# Patient Record
Sex: Male | Born: 1960 | Race: White | Hispanic: No | Marital: Married | Smoking: Never smoker
Health system: Southern US, Community
[De-identification: ages and names within clinical notes are randomized; demographics above are authoritative.]

## PROBLEM LIST (undated history)

## (undated) DIAGNOSIS — J302 Other seasonal allergic rhinitis: Secondary | ICD-10-CM

## (undated) DIAGNOSIS — E119 Type 2 diabetes mellitus without complications: Secondary | ICD-10-CM

## (undated) DIAGNOSIS — K5790 Diverticulosis of intestine, part unspecified, without perforation or abscess without bleeding: Secondary | ICD-10-CM

## (undated) DIAGNOSIS — I10 Essential (primary) hypertension: Secondary | ICD-10-CM

## (undated) DIAGNOSIS — F419 Anxiety disorder, unspecified: Secondary | ICD-10-CM

## (undated) DIAGNOSIS — E785 Hyperlipidemia, unspecified: Secondary | ICD-10-CM

## (undated) DIAGNOSIS — I2510 Atherosclerotic heart disease of native coronary artery without angina pectoris: Secondary | ICD-10-CM

## (undated) DIAGNOSIS — T7840XA Allergy, unspecified, initial encounter: Secondary | ICD-10-CM

## (undated) HISTORY — PX: WISDOM TOOTH EXTRACTION: SHX21

## (undated) HISTORY — PX: NASAL RECONSTRUCTION: SHX2069

## (undated) HISTORY — PX: BILE DUCT EXPLORATION: SHX1225

## (undated) HISTORY — DX: Anxiety disorder, unspecified: F41.9

## (undated) HISTORY — DX: Essential (primary) hypertension: I10

## (undated) HISTORY — DX: Type 2 diabetes mellitus without complications: E11.9

## (undated) HISTORY — DX: Hyperlipidemia, unspecified: E78.5

## (undated) HISTORY — PX: KNEE SURGERY: SHX244

## (undated) HISTORY — DX: Diverticulosis of intestine, part unspecified, without perforation or abscess without bleeding: K57.90

## (undated) HISTORY — PX: TONSILLECTOMY: SUR1361

## (undated) HISTORY — DX: Allergy, unspecified, initial encounter: T78.40XA

---

## 2005-03-03 ENCOUNTER — Ambulatory Visit: Payer: Self-pay | Admitting: Internal Medicine

## 2005-03-21 ENCOUNTER — Ambulatory Visit: Payer: Self-pay | Admitting: Internal Medicine

## 2005-05-19 ENCOUNTER — Ambulatory Visit: Payer: Self-pay | Admitting: Internal Medicine

## 2005-05-26 ENCOUNTER — Ambulatory Visit: Payer: Self-pay | Admitting: Internal Medicine

## 2005-07-19 ENCOUNTER — Ambulatory Visit: Payer: Self-pay | Admitting: Internal Medicine

## 2005-07-28 ENCOUNTER — Ambulatory Visit: Payer: Self-pay | Admitting: Internal Medicine

## 2005-12-19 ENCOUNTER — Ambulatory Visit: Payer: Self-pay | Admitting: Internal Medicine

## 2006-01-01 ENCOUNTER — Ambulatory Visit: Payer: Self-pay | Admitting: Internal Medicine

## 2006-02-01 ENCOUNTER — Ambulatory Visit: Payer: Self-pay | Admitting: Internal Medicine

## 2006-07-16 ENCOUNTER — Ambulatory Visit: Payer: Self-pay | Admitting: Internal Medicine

## 2006-07-23 ENCOUNTER — Ambulatory Visit: Payer: Self-pay | Admitting: Internal Medicine

## 2007-11-18 ENCOUNTER — Ambulatory Visit: Payer: Self-pay | Admitting: Internal Medicine

## 2007-11-18 DIAGNOSIS — I10 Essential (primary) hypertension: Secondary | ICD-10-CM | POA: Insufficient documentation

## 2007-11-18 DIAGNOSIS — R10814 Left lower quadrant abdominal tenderness: Secondary | ICD-10-CM

## 2007-11-18 DIAGNOSIS — K5730 Diverticulosis of large intestine without perforation or abscess without bleeding: Secondary | ICD-10-CM | POA: Insufficient documentation

## 2007-11-18 LAB — CONVERTED CEMR LAB
ALT: 28 units/L (ref 0–53)
Basophils Relative: 0.4 % (ref 0.0–1.0)
Eosinophils Absolute: 0.2 10*3/uL (ref 0.0–0.6)
Hemoglobin: 15.2 g/dL (ref 13.0–17.0)
Monocytes Absolute: 0.8 10*3/uL — ABNORMAL HIGH (ref 0.2–0.7)
Monocytes Relative: 10.9 % (ref 3.0–11.0)
Neutro Abs: 3.8 10*3/uL (ref 1.4–7.7)
Platelets: 168 10*3/uL (ref 150–400)
WBC: 7 10*3/uL (ref 4.5–10.5)

## 2008-01-17 ENCOUNTER — Ambulatory Visit: Payer: Self-pay | Admitting: Internal Medicine

## 2008-01-17 DIAGNOSIS — E785 Hyperlipidemia, unspecified: Secondary | ICD-10-CM

## 2008-06-05 ENCOUNTER — Ambulatory Visit: Payer: Self-pay | Admitting: Internal Medicine

## 2008-06-05 LAB — CONVERTED CEMR LAB
ALT: 37 units/L (ref 0–53)
Alkaline Phosphatase: 47 units/L (ref 39–117)
Basophils Relative: 0.5 % (ref 0.0–1.0)
Blood in Urine, dipstick: NEGATIVE
CO2: 30 meq/L (ref 19–32)
Cholesterol: 173 mg/dL (ref 0–200)
Creatinine, Ser: 1 mg/dL (ref 0.4–1.5)
Eosinophils Absolute: 0.2 10*3/uL (ref 0.0–0.7)
GFR calc Af Amer: 104 mL/min
GFR calc non Af Amer: 86 mL/min
Glucose, Urine, Semiquant: NEGATIVE
Lymphocytes Relative: 27.4 % (ref 12.0–46.0)
MCHC: 35 g/dL (ref 30.0–36.0)
MCV: 92.1 fL (ref 78.0–100.0)
Neutro Abs: 3 10*3/uL (ref 1.4–7.7)
Neutrophils Relative %: 58.3 % (ref 43.0–77.0)
Nitrite: NEGATIVE
Platelets: 135 10*3/uL — ABNORMAL LOW (ref 150–400)
Specific Gravity, Urine: 1.025
TSH: 2.15 microintl units/mL (ref 0.35–5.50)
Total Bilirubin: 0.9 mg/dL (ref 0.3–1.2)
Total Protein: 7 g/dL (ref 6.0–8.3)
Triglycerides: 81 mg/dL (ref 0–149)
pH: 6.5

## 2008-06-16 ENCOUNTER — Telehealth: Payer: Self-pay | Admitting: *Deleted

## 2008-07-24 ENCOUNTER — Ambulatory Visit: Payer: Self-pay | Admitting: Internal Medicine

## 2008-10-23 ENCOUNTER — Ambulatory Visit: Payer: Self-pay | Admitting: Internal Medicine

## 2009-01-02 ENCOUNTER — Encounter: Payer: Self-pay | Admitting: Emergency Medicine

## 2009-01-03 ENCOUNTER — Observation Stay (HOSPITAL_COMMUNITY): Admission: EM | Admit: 2009-01-03 | Discharge: 2009-01-03 | Payer: Self-pay | Admitting: Emergency Medicine

## 2009-01-07 ENCOUNTER — Ambulatory Visit (HOSPITAL_BASED_OUTPATIENT_CLINIC_OR_DEPARTMENT_OTHER): Admission: RE | Admit: 2009-01-07 | Discharge: 2009-01-07 | Payer: Self-pay | Admitting: Plastic Surgery

## 2009-07-30 ENCOUNTER — Telehealth: Payer: Self-pay | Admitting: *Deleted

## 2009-10-01 ENCOUNTER — Ambulatory Visit: Payer: Self-pay | Admitting: Internal Medicine

## 2009-10-01 LAB — CONVERTED CEMR LAB
Alkaline Phosphatase: 71 units/L (ref 39–117)
BUN: 11 mg/dL (ref 6–23)
Bilirubin Urine: NEGATIVE
Bilirubin, Direct: 0.2 mg/dL (ref 0.0–0.3)
CO2: 29 meq/L (ref 19–32)
Calcium: 8.8 mg/dL (ref 8.4–10.5)
Chloride: 102 meq/L (ref 96–112)
Creatinine, Ser: 0.9 mg/dL (ref 0.4–1.5)
Eosinophils Absolute: 0.2 10*3/uL (ref 0.0–0.7)
GFR calc non Af Amer: 96.26 mL/min (ref >60)
HCT: 43.6 % (ref 39.0–52.0)
Ketones, urine, test strip: NEGATIVE
Lymphs Abs: 1.3 10*3/uL (ref 0.7–4.0)
Monocytes Absolute: 0.5 10*3/uL (ref 0.1–1.0)
Platelets: 129 10*3/uL — ABNORMAL LOW (ref 150.0–400.0)
Potassium: 3.8 meq/L (ref 3.5–5.1)
RBC: 4.6 M/uL (ref 4.22–5.81)
Sodium: 140 meq/L (ref 135–145)
Specific Gravity, Urine: 1.02
TSH: 1.79 microintl units/mL (ref 0.35–5.50)
Total Bilirubin: 1.2 mg/dL (ref 0.3–1.2)
Total CHOL/HDL Ratio: 4
VLDL: 14.4 mg/dL (ref 0.0–40.0)
WBC Urine, dipstick: NEGATIVE
WBC: 5.1 10*3/uL (ref 4.5–10.5)

## 2009-10-08 ENCOUNTER — Ambulatory Visit: Payer: Self-pay | Admitting: Internal Medicine

## 2010-03-04 ENCOUNTER — Ambulatory Visit (HOSPITAL_COMMUNITY): Admission: RE | Admit: 2010-03-04 | Discharge: 2010-03-04 | Payer: Self-pay | Admitting: Specialist

## 2010-03-25 ENCOUNTER — Ambulatory Visit: Payer: Self-pay | Admitting: Internal Medicine

## 2010-03-25 LAB — CONVERTED CEMR LAB
ALT: 41 U/L
AST: 45 U/L — ABNORMAL HIGH
Albumin: 3.8 g/dL
Alkaline Phosphatase: 58 U/L
Bilirubin, Direct: 0.2 mg/dL
Cholesterol: 163 mg/dL
HDL: 52.9 mg/dL
LDL Cholesterol: 95 mg/dL
Total Bilirubin: 1 mg/dL
Total CHOL/HDL Ratio: 3
Total Protein: 7 g/dL
Triglycerides: 75 mg/dL
VLDL: 15 mg/dL

## 2010-04-08 ENCOUNTER — Ambulatory Visit: Payer: Self-pay | Admitting: Internal Medicine

## 2010-04-08 LAB — CONVERTED CEMR LAB: HDL goal, serum: 40 mg/dL

## 2010-10-07 ENCOUNTER — Ambulatory Visit: Payer: Self-pay | Admitting: Internal Medicine

## 2010-10-07 LAB — CONVERTED CEMR LAB
AST: 54 units/L — ABNORMAL HIGH (ref 0–37)
Albumin: 3.6 g/dL (ref 3.5–5.2)
Alkaline Phosphatase: 82 units/L (ref 39–117)
Basophils Relative: 0.3 % (ref 0.0–3.0)
Bilirubin, Direct: 0.2 mg/dL (ref 0.0–0.3)
CO2: 30 meq/L (ref 19–32)
Calcium: 9 mg/dL (ref 8.4–10.5)
Chloride: 101 meq/L (ref 96–112)
Cholesterol: 178 mg/dL (ref 0–200)
Creatinine, Ser: 0.9 mg/dL (ref 0.4–1.5)
Eosinophils Absolute: 0.1 10*3/uL (ref 0.0–0.7)
Eosinophils Relative: 2.1 % (ref 0.0–5.0)
Glucose, Bld: 215 mg/dL — ABNORMAL HIGH (ref 70–99)
HDL: 45.7 mg/dL (ref >39.00)
Lymphs Abs: 1.3 10*3/uL (ref 0.7–4.0)
MCHC: 34.7 g/dL (ref 30.0–36.0)
MCV: 94.7 fL (ref 78.0–100.0)
Monocytes Absolute: 0.6 10*3/uL (ref 0.1–1.0)
Platelets: 106 10*3/uL — ABNORMAL LOW (ref 150.0–400.0)
Potassium: 4.3 meq/L (ref 3.5–5.1)
RBC: 4.7 M/uL (ref 4.22–5.81)
Total CHOL/HDL Ratio: 4
Triglycerides: 139 mg/dL (ref 0.0–149.0)
WBC Urine, dipstick: NEGATIVE

## 2010-10-28 ENCOUNTER — Ambulatory Visit: Payer: Self-pay | Admitting: Internal Medicine

## 2010-10-28 LAB — HM DIABETES EYE EXAM

## 2011-01-10 NOTE — Assessment & Plan Note (Signed)
Summary: 6 month rov/njr//wife rescd from [place]//ccm   Vital Signs:  Patient profile:   50 year old male Height:      71 inches Weight:      235 pounds BMI:     32.89 Temp:     98.2 degrees F oral Pulse rate:   72 / minute Resp:     14 per minute BP sitting:   122 / 80  (left arm)  Vitals Entered By: Willy Eddy, LPN (April 08, 2010 8:29 AM) CC: roa labs, Hypertension Management, Lipid Management   CC:  roa labs, Hypertension Management, and Lipid Management.  History of Present Illness: the pt has a herniated disc in the neck and has seen dr Thomasena Edis for this and has cortisone shots has lost weight  and lipids are reviewed  Hyperlipidemia Follow-Up      This is a 50 year old man who presents for Hyperlipidemia follow-up.  The patient denies muscle aches, GI upset, abdominal pain, flushing, itching, constipation, diarrhea, and fatigue.  The patient denies the following symptoms: chest pain/pressure, exercise intolerance, dypsnea, palpitations, syncope, and pedal edema.  Compliance with medications (by patient report) has been near 100%.  Dietary compliance has been good.  The patient reports exercising 3-4X per week.  Adjunctive measures currently used by the patient include fish oil supplements.    Hypertension History:      He denies headache, chest pain, palpitations, dyspnea with exertion, orthopnea, PND, peripheral edema, visual symptoms, neurologic problems, syncope, and side effects from treatment.        Positive major cardiovascular risk factors include male age 78 years old or older, hyperlipidemia, and hypertension.  Negative major cardiovascular risk factors include non-tobacco-user status.        Further assessment for target organ damage reveals no history of ASHD, stroke/TIA, or peripheral vascular disease.    Lipid Management History:      Positive NCEP/ATP III risk factors include male age 16 years old or older and hypertension.  Negative NCEP/ATP III risk  factors include non-tobacco-user status, no ASHD (atherosclerotic heart disease), no prior stroke/TIA, no peripheral vascular disease, and no history of aortic aneurysm.      Preventive Screening-Counseling & Management  Alcohol-Tobacco     Smoking Status: never  Problems Prior to Update: 1)  Preventive Health Care  (ICD-V70.0) 2)  Hyperlipidemia  (ICD-272.4) 3)  Family History of Cad Male 1st Degree Relative <50  (ICD-V17.3) 4)  Family History Diabetes 1st Degree Relative  (ICD-V18.0) 5)  Hypertension  (ICD-401.9) 6)  Abdominal Tenderness, Left Lower Quadrant  (ICD-789.64) 7)  Diverticulosis, Colon  (ICD-562.10)  Current Problems (verified): 1)  Preventive Health Care  (ICD-V70.0) 2)  Hyperlipidemia  (ICD-272.4) 3)  Family History of Cad Male 1st Degree Relative <50  (ICD-V17.3) 4)  Family History Diabetes 1st Degree Relative  (ICD-V18.0) 5)  Hypertension  (ICD-401.9) 6)  Abdominal Tenderness, Left Lower Quadrant  (ICD-789.64) 7)  Diverticulosis, Colon  (ICD-562.10)  Medications Prior to Update: 1)  Crestor 20 Mg  Tabs (Rosuvastatin Calcium) .... One By Mouth Daily 2)  Benicar 20 Mg  Tabs (Olmesartan Medoxomil) .... 1/2 Tablet  Current Medications (verified): 1)  Crestor 20 Mg  Tabs (Rosuvastatin Calcium) .... One By Mouth Daily 2)  Benicar 20 Mg  Tabs (Olmesartan Medoxomil) .... 1/2 Tablet 3)  Vicodin 5-500 Mg Tabs (Hydrocodone-Acetaminophen) .... Every 4-6 Hours As Needed Pain  Allergies (verified): No Known Drug Allergies  Past History:  Family History: Last updated: 01/17/2008  father: cancer  ( skin) mother Family History Diabetes 1st degree relative brother MI 35 Family History of CAD Male 1st degree relative <50  Social History: Last updated: 01/17/2008 Married Never Smoked Alcohol use-yes Drug use-no Regular exercise-yes  Risk Factors: Exercise: yes (01/17/2008)  Risk Factors: Smoking Status: never (04/08/2010)  Past medical, surgical, family  and social histories (including risk factors) reviewed, and no changes noted (except as noted below).  Past Medical History: Reviewed history from 01/17/2008 and no changes required. Diverticulosis, colon Hypertension Hyperlipidemia  Past Surgical History: Reviewed history from 01/17/2008 and no changes required. knee surgery  Family History: Reviewed history from 01/17/2008 and no changes required. father: cancer  ( skin) mother Family History Diabetes 1st degree relative brother MI 51 Family History of CAD Male 1st degree relative <50  Social History: Reviewed history from 01/17/2008 and no changes required. Married Never Smoked Alcohol use-yes Drug use-no Regular exercise-yes  Review of Systems  The patient denies anorexia, fever, weight loss, weight gain, vision loss, decreased hearing, hoarseness, chest pain, syncope, dyspnea on exertion, peripheral edema, prolonged cough, headaches, hemoptysis, abdominal pain, melena, hematochezia, severe indigestion/heartburn, hematuria, incontinence, genital sores, muscle weakness, suspicious skin lesions, transient blindness, difficulty walking, depression, unusual weight change, abnormal bleeding, enlarged lymph nodes, angioedema, and breast masses.    Physical Exam  General:  Well-developed,well-nourished,in no acute distress; alert,appropriate and cooperative throughout examination Head:  Normocephalic and atraumatic without obvious abnormalities. No apparent alopecia or balding. Eyes:  pupils equal and pupils round.   Ears:  R ear normal and L ear normal.   Nose:  no external deformity and no nasal discharge.   Mouth:  Oral mucosa and oropharynx without lesions or exudates.  Teeth in good repair. Neck:  No deformities, masses, or tenderness noted. Lungs:  normal respiratory effort, no intercostal retractions, and no wheezes.   Heart:  normal rate and regular rhythm.   Abdomen:  soft, non-tender, and normal bowel sounds.     Msk:  normal ROM, no joint tenderness, and no joint swelling.   Neurologic:  alert & oriented X3, cranial nerves II-XII intact, and strength normal in all extremities.     Impression & Recommendations:  Problem # 1:  HYPERLIPIDEMIA (ICD-272.4)  His updated medication list for this problem includes:    Crestor 20 Mg Tabs (Rosuvastatin calcium) ..... One by mouth daily  Labs Reviewed: SGOT: 45 (03/25/2010)   SGPT: 41 (03/25/2010)  Lipid Goals: Chol Goal: 200 (04/08/2010)   HDL Goal: 40 (04/08/2010)   LDL Goal: 130 (04/08/2010)   TG Goal: 150 (04/08/2010)  10 Yr Risk Heart Disease: 3 % Prior 10 Yr Risk Heart Disease: 9 % (10/23/2008)   HDL:52.90 (03/25/2010), 47.90 (10/01/2009)  LDL:95 (03/25/2010), 107 (60/45/4098)  Chol:163 (03/25/2010), 169 (10/01/2009)  Trig:75.0 (03/25/2010), 72.0 (10/01/2009)  Problem # 2:  HYPERTENSION (ICD-401.9)  His updated medication list for this problem includes:    Benicar 20 Mg Tabs (Olmesartan medoxomil) .Marland Kitchen... 1/2 tablet  BP today: 122/80 Prior BP: 130/82 (10/08/2009)  10 Yr Risk Heart Disease: 3 % Prior 10 Yr Risk Heart Disease: 9 % (10/23/2008)  Labs Reviewed: K+: 3.8 (10/01/2009) Creat: : 0.9 (10/01/2009)   Chol: 163 (03/25/2010)   HDL: 52.90 (03/25/2010)   LDL: 95 (03/25/2010)   TG: 75.0 (03/25/2010)  Problem # 3:  ABDOMINAL TENDERNESS, LEFT LOWER QUADRANT (ICD-789.64) resolved  Complete Medication List: 1)  Crestor 20 Mg Tabs (Rosuvastatin calcium) .... One by mouth daily 2)  Benicar 20 Mg Tabs (Olmesartan medoxomil) .Marland KitchenMarland KitchenMarland Kitchen  1/2 tablet 3)  Vicodin 5-500 Mg Tabs (Hydrocodone-acetaminophen) .... Every 4-6 hours as needed pain  Hypertension Assessment/Plan:      The patient's hypertensive risk group is category B: At least one risk factor (excluding diabetes) with no target organ damage.  His calculated 10 year risk of coronary heart disease is 3 %.  Today's blood pressure is 122/80.  His blood pressure goal is < 140/90.  Lipid  Assessment/Plan:      Based on NCEP/ATP III, the patient's risk factor category is "2 or more risk factors and a calculated 10 year CAD risk of < 20%".  The patient's lipid goals are as follows: Total cholesterol goal is 200; LDL cholesterol goal is 130; HDL cholesterol goal is 40; Triglyceride goal is 150.  His LDL cholesterol goal has been met.    Patient Instructions: 1)  OCT CPX

## 2011-01-10 NOTE — Assessment & Plan Note (Signed)
Summary: cpx/njr/wife rescd//ccm   Vital Signs:  Patient profile:   50 year old male Height:      71 inches Weight:      238 pounds BMI:     33.31 Temp:     98.2 degrees F oral Pulse rate:   72 / minute Resp:     14 per minute BP sitting:   130 / 90  (left arm)  Vitals Entered By: Willy Eddy, LPN (October 28, 2010 2:13 PM) CC: cpx, Hypertension Management Is Patient Diabetic? No   Primary Care Provider:  Stacie Glaze MD  CC:  cpx and Hypertension Management.  History of Present Illness: The pt was asked about all immunizations, health maint. services that are appropriate to their age and was given guidance on diet exercize  and weight management  HTN stable weight gain and genetic predisposition has resulted in him becoming an AODM ( new diagnosis) wwe spent 30 min on teaching, monitering and explaining thje benifits and risks on new medications  Hypertension History:      He denies headache, chest pain, palpitations, dyspnea with exertion, orthopnea, PND, peripheral edema, visual symptoms, neurologic problems, syncope, and side effects from treatment.        Positive major cardiovascular risk factors include male age 6 years old or older, diabetes, hyperlipidemia, and hypertension.  Negative major cardiovascular risk factors include non-tobacco-user status.        Further assessment for target organ damage reveals no history of ASHD, stroke/TIA, or peripheral vascular disease.     Preventive Screening-Counseling & Management  Alcohol-Tobacco     Smoking Status: never  Problems Prior to Update: 1)  Diab W/oth Manifests Type Ii/uns Type Uncntrl  (ICD-250.82) 2)  Preventive Health Care  (ICD-V70.0) 3)  Hyperlipidemia  (ICD-272.4) 4)  Family History of Cad Male 1st Degree Relative <50  (ICD-V17.3) 5)  Family History Diabetes 1st Degree Relative  (ICD-V18.0) 6)  Hypertension  (ICD-401.9) 7)  Abdominal Tenderness, Left Lower Quadrant  (ICD-789.64) 8)   Diverticulosis, Colon  (ICD-562.10)  Current Problems (verified): 1)  Preventive Health Care  (ICD-V70.0) 2)  Hyperlipidemia  (ICD-272.4) 3)  Family History of Cad Male 1st Degree Relative <50  (ICD-V17.3) 4)  Family History Diabetes 1st Degree Relative  (ICD-V18.0) 5)  Hypertension  (ICD-401.9) 6)  Abdominal Tenderness, Left Lower Quadrant  (ICD-789.64) 7)  Diverticulosis, Colon  (ICD-562.10)  Medications Prior to Update: 1)  Crestor 20 Mg  Tabs (Rosuvastatin Calcium) .... One By Mouth Daily 2)  Benicar 20 Mg  Tabs (Olmesartan Medoxomil) .... 1/2 Tablet 3)  Vicodin 5-500 Mg Tabs (Hydrocodone-Acetaminophen) .... Every 4-6 Hours As Needed Pain  Current Medications (verified): 1)  Crestor 20 Mg  Tabs (Rosuvastatin Calcium) .... One By Mouth Daily 2)  Benicar 20 Mg  Tabs (Olmesartan Medoxomil) .... 1/2 Tablet 3)  Kombiglyze Xr 2.04-999 Mg Xr24h-Tab (Saxagliptin-Metformin) .... One By Mouth Daily  Allergies (verified): No Known Drug Allergies  Past History:  Family History: Last updated: 01/17/2008 father: cancer  ( skin) mother Family History Diabetes 1st degree relative brother MI 63 Family History of CAD Male 1st degree relative <50  Social History: Last updated: 01/17/2008 Married Never Smoked Alcohol use-yes Drug use-no Regular exercise-yes  Risk Factors: Exercise: yes (01/17/2008)  Risk Factors: Smoking Status: never (10/28/2010)  Past medical, surgical, family and social histories (including risk factors) reviewed, and no changes noted (except as noted below).  Past Medical History: Reviewed history from 01/17/2008 and no changes required. Diverticulosis,  colon Hypertension Hyperlipidemia  Past Surgical History: Reviewed history from 01/17/2008 and no changes required. knee surgery  Family History: Reviewed history from 01/17/2008 and no changes required. father: cancer  ( skin) mother Family History Diabetes 1st degree relative brother MI  69 Family History of CAD Male 1st degree relative <50  Social History: Reviewed history from 01/17/2008 and no changes required. Married Never Smoked Alcohol use-yes Drug use-no Regular exercise-yes  Review of Systems  The patient denies anorexia, fever, weight loss, weight gain, vision loss, decreased hearing, hoarseness, chest pain, syncope, dyspnea on exertion, peripheral edema, prolonged cough, headaches, hemoptysis, abdominal pain, melena, hematochezia, severe indigestion/heartburn, hematuria, incontinence, genital sores, muscle weakness, suspicious skin lesions, transient blindness, difficulty walking, depression, unusual weight change, abnormal bleeding, enlarged lymph nodes, angioedema, breast masses, and testicular masses.    Physical Exam  General:  Well-developed,well-nourished,in no acute distress; alert,appropriate and cooperative throughout examination Head:  Normocephalic and atraumatic without obvious abnormalities. No apparent alopecia or balding. Eyes:  pupils equal and pupils round.   Ears:  R ear normal and L ear normal.   Nose:  no external deformity and no nasal discharge.   Mouth:  Oral mucosa and oropharynx without lesions or exudates.  Teeth in good repair. Neck:  No deformities, masses, or tenderness noted. Chest Wall:  no deformities and no tenderness.   Lungs:  normal respiratory effort, no intercostal retractions, and no wheezes.   Heart:  normal rate and regular rhythm.   Abdomen:  soft, non-tender, and normal bowel sounds.   Rectal:  normal sphincter tone and no masses.   Prostate:  no gland enlargement and no asymmetry.   Msk:  No deformity or scoliosis noted of thoracic or lumbar spine.   Pulses:  R and L carotid,radial,femoral,dorsalis pedis and posterior tibial pulses are full and equal bilaterally  Diabetes Management Exam:    Foot Exam (with socks and/or shoes not present):       Sensory-Pinprick/Light touch:          Left medial foot (L-4):  normal          Left dorsal foot (L-5): normal          Left lateral foot (S-1): normal          Right medial foot (L-4): normal          Right dorsal foot (L-5): normal          Right lateral foot (S-1): normal       Sensory-Monofilament:          Left foot: normal          Right foot: normal       Inspection:          Left foot: normal          Right foot: normal       Nails:          Left foot: normal          Right foot: normal   Impression & Recommendations:  Problem # 1:  PREVENTIVE HEALTH CARE (ICD-V70.0)  Td Booster: Tdap (10/28/2010)   Chol: 178 (10/07/2010)   HDL: 45.70 (10/07/2010)   LDL: 105 (10/07/2010)   TG: 139.0 (10/07/2010) TSH: 1.99 (10/07/2010)    Discussed using sunscreen, use of alcohol, drug use, self testicular exam, routine dental care, routine eye care, routine physical exam, seat belts, multiple vitamins, osteoporosis prevention, adequate calcium intake in diet, and recommendations for immunizations.  Discussed exercise and checking  cholesterol.  Discussed gun safety, safe sex, and contraception. Also recommend checking PSA.  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 1/2 tablet    Kombiglyze Xr 2.04-999 Mg Xr24h-tab (Saxagliptin-metformin) ..... One by mouth daily  Problem # 3:  HYPERTENSION (ICD-401.9) Assessment: Unchanged  His updated medication list for this problem includes:    Benicar 20 Mg Tabs (Olmesartan medoxomil) .Marland Kitchen... 1/2 tablet  BP today: 130/90 Prior BP: 122/80 (04/08/2010)  10 Yr Risk Heart Disease: 14 % Prior 10 Yr Risk Heart Disease: 3 % (04/08/2010)  Labs Reviewed: K+: 4.3 (10/07/2010) Creat: : 0.9 (10/07/2010)   Chol: 178 (10/07/2010)   HDL: 45.70 (10/07/2010)   LDL: 105 (10/07/2010)   TG: 139.0  (10/07/2010)  Complete Medication List: 1)  Crestor 20 Mg Tabs (Rosuvastatin calcium) .... One by mouth daily 2)  Benicar 20 Mg Tabs (Olmesartan medoxomil) .... 1/2 tablet 3)  Kombiglyze Xr 2.04-999 Mg Xr24h-tab (Saxagliptin-metformin) .... One by mouth daily  Other Orders: Tdap => 43yrs IM (16109) Admin 1st Vaccine (60454)  Hypertension Assessment/Plan:      The patient's hypertensive risk group is category C: Target organ damage and/or diabetes.  His calculated 10 year risk of coronary heart disease is 14 %.  Today's blood pressure is 130/90.  His blood pressure goal is < 140/90.  Patient Instructions: 1)  Please schedule a follow-up appointment in 2 months. 2)  BMP prior to visit, ICD-9:  995.20 3)  HbgA1C prior to visit, ICD-9:250.00   Orders Added: 1)  Tdap => 30yrs IM [90715] 2)  Admin 1st Vaccine [90471] 3)  Est. Patient 40-64 years [99396] 4)  Est. Patient Level IV [09811]   Immunizations Administered:  Tetanus Vaccine:    Vaccine Type: Tdap    Site: left deltoid    Mfr: GlaxoSmithKline    Dose: 0.5 ml    Route: IM    Given by: Willy Eddy, LPN    Exp. Date: 09/29/2012    Lot #: [id number]    VIS given: 10/28/08 version given October 28, 2010.   Immunizations Administered:  Tetanus Vaccine:    Vaccine Type: Tdap    Site: left deltoid    Mfr: GlaxoSmithKline    Dose: 0.5 ml    Route: IM    Given by: Willy Eddy, LPN    Exp. Date: 09/29/2012    Lot #: [id number]    VIS given: 10/28/08 version given October 28, 2010.

## 2011-02-03 ENCOUNTER — Other Ambulatory Visit (INDEPENDENT_AMBULATORY_CARE_PROVIDER_SITE_OTHER): Payer: 59 | Admitting: Internal Medicine

## 2011-02-03 DIAGNOSIS — E119 Type 2 diabetes mellitus without complications: Secondary | ICD-10-CM

## 2011-02-03 LAB — BASIC METABOLIC PANEL
Creatinine, Ser: 1 mg/dL (ref 0.4–1.5)
GFR: 85.74 mL/min (ref >60.00)

## 2011-02-03 LAB — HEMOGLOBIN A1C: Hgb A1c MFr Bld: 6.9 % — ABNORMAL HIGH (ref 4.6–6.5)

## 2011-02-09 ENCOUNTER — Encounter: Payer: Self-pay | Admitting: Internal Medicine

## 2011-02-10 ENCOUNTER — Ambulatory Visit (INDEPENDENT_AMBULATORY_CARE_PROVIDER_SITE_OTHER): Payer: 59 | Admitting: Internal Medicine

## 2011-02-10 ENCOUNTER — Encounter: Payer: Self-pay | Admitting: Internal Medicine

## 2011-02-10 DIAGNOSIS — F411 Generalized anxiety disorder: Secondary | ICD-10-CM

## 2011-02-10 DIAGNOSIS — I10 Essential (primary) hypertension: Secondary | ICD-10-CM

## 2011-02-10 DIAGNOSIS — E785 Hyperlipidemia, unspecified: Secondary | ICD-10-CM

## 2011-02-10 DIAGNOSIS — F419 Anxiety disorder, unspecified: Secondary | ICD-10-CM

## 2011-02-10 DIAGNOSIS — F329 Major depressive disorder, single episode, unspecified: Secondary | ICD-10-CM

## 2011-02-10 DIAGNOSIS — E1165 Type 2 diabetes mellitus with hyperglycemia: Secondary | ICD-10-CM

## 2011-02-10 MED ORDER — SITAGLIPTIN PHOS-METFORMIN HCL 50-1000 MG PO TABS: 1.0000 | ORAL_TABLET | Freq: Two times a day (BID) | ORAL | Status: DC

## 2011-02-10 MED ORDER — SERTRALINE HCL 25 MG PO TABS: 25.0000 mg | ORAL_TABLET | Freq: Every day | ORAL | Status: DC

## 2011-02-10 NOTE — Assessment & Plan Note (Signed)
The patient will be placed on 25 mg of Zoloft trial followup in 3 months
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
## 2011-02-10 NOTE — Assessment & Plan Note (Signed)
Marked decrease in his blood glucoses down from 215 to the 150s however our goal is to get his A1c below 6-1/2 so that he will not have any risk for either kidney or heart disease do that would change kombiglyze  To the 1000/5 and I believe this should result in getting her A1c down we will monitor and A1c a lipid and liver in 3 months time

## 2011-02-10 NOTE — Assessment & Plan Note (Signed)
Weight loss  And medications are helping control BP and risk

## 2011-02-10 NOTE — Assessment & Plan Note (Signed)
Will monitor lipids on crestor Has lost weight

## 2011-03-27 LAB — CBC
HCT: 38.3 % — ABNORMAL LOW (ref 39.0–52.0)
Hemoglobin: 15.9 g/dL (ref 13.0–17.0)
MCHC: 34.2 g/dL (ref 30.0–36.0)
Platelets: 149 10*3/uL — ABNORMAL LOW (ref 150–400)
Platelets: 166 10*3/uL (ref 150–400)
RBC: 5.25 MIL/uL (ref 4.22–5.81)
RDW: 12.7 % (ref 11.5–15.5)
WBC: 8.1 10*3/uL (ref 4.0–10.5)

## 2011-03-27 LAB — SAMPLE TO BLOOD BANK

## 2011-03-27 LAB — POCT I-STAT, CHEM 8
BUN: 13 mg/dL (ref 6–23)
BUN: 9 mg/dL (ref 6–23)
Calcium, Ion: 0.94 mmol/L — ABNORMAL LOW (ref 1.12–1.32)
Chloride: 105 mEq/L (ref 96–112)
Chloride: 106 mEq/L (ref 96–112)
Glucose, Bld: 135 mg/dL — ABNORMAL HIGH (ref 70–99)
Potassium: 3.3 mEq/L — ABNORMAL LOW (ref 3.5–5.1)
Potassium: 3.7 mEq/L (ref 3.5–5.1)
TCO2: 27 mmol/L (ref 0–100)

## 2011-04-25 NOTE — Op Note (Signed)
NAME:  HOLLAND, NICKSON NO.:  [id number]   MEDICAL RECORD NO.:  [id number]          PATIENT TYPE:  INP   LOCATION:  5128                         FACILITY:  [facility]   PHYSICIAN:  Adolph Pollack, M.D.DATE OF BIRTH:  12-06-1961   DATE OF PROCEDURE:  01/03/2009  DATE OF DISCHARGE:                               OPERATIVE REPORT   PREOPERATIVE DIAGNOSIS:  Two right-sided nasal lacerations.   POSTOPERATIVE DIAGNOSIS:  Two right-sided nasal lacerations.   PROCEDURE:  Closure of nasal lacerations with Dermabond glue.   SURGEON:  Adolph Pollack, MD   TECHNIQUE:  The areas were sterilely cleansed and then dried.  There was  an 8-mm laceration closed in the right side of the nose and a 4-mm  laceration closed, both with glue.  This was well away from the eye and  the eyelids worked well.  He tolerated the procedure well without  apparent complications.      Adolph Pollack, M.D.  Electronically Signed     TJR/MEDQ  D:  01/03/2009  T:  01/03/2009  Job:  [id number]
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
## 2011-05-12 ENCOUNTER — Other Ambulatory Visit (INDEPENDENT_AMBULATORY_CARE_PROVIDER_SITE_OTHER): Payer: 59

## 2011-05-12 DIAGNOSIS — I10 Essential (primary) hypertension: Secondary | ICD-10-CM

## 2011-05-12 LAB — LIPID PANEL
Cholesterol: 184 mg/dL (ref 0–200)
HDL: 63.7 mg/dL (ref >39.00)
LDL Cholesterol: 109 mg/dL — ABNORMAL HIGH (ref 0–99)
Total CHOL/HDL Ratio: 3
Triglycerides: 56 mg/dL (ref 0.0–149.0)

## 2011-05-12 LAB — HEPATIC FUNCTION PANEL
ALT: 32 U/L (ref 0–53)
AST: 38 U/L — ABNORMAL HIGH (ref 0–37)
Total Bilirubin: 1.6 mg/dL — ABNORMAL HIGH (ref 0.3–1.2)

## 2011-05-19 ENCOUNTER — Ambulatory Visit (INDEPENDENT_AMBULATORY_CARE_PROVIDER_SITE_OTHER): Payer: 59 | Admitting: Internal Medicine

## 2011-05-19 ENCOUNTER — Encounter: Payer: Self-pay | Admitting: Internal Medicine

## 2011-05-19 VITALS — BP 130/78 | HR 60 | Temp 98.2°F | Resp 16 | Ht 72.0 in | Wt 222.0 lb

## 2011-05-19 DIAGNOSIS — F411 Generalized anxiety disorder: Secondary | ICD-10-CM

## 2011-05-19 DIAGNOSIS — E1165 Type 2 diabetes mellitus with hyperglycemia: Secondary | ICD-10-CM

## 2011-05-19 DIAGNOSIS — F419 Anxiety disorder, unspecified: Secondary | ICD-10-CM

## 2011-05-19 DIAGNOSIS — I10 Essential (primary) hypertension: Secondary | ICD-10-CM

## 2011-05-19 MED ORDER — SITAGLIPTIN PHOS-METFORMIN HCL 50-1000 MG PO TABS: 1.0000 | ORAL_TABLET | Freq: Two times a day (BID) | ORAL | Status: DC

## 2011-05-19 MED ORDER — SERTRALINE HCL 25 MG PO TABS: 25.0000 mg | ORAL_TABLET | Freq: Every day | ORAL | Status: DC
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
## 2011-07-19 ENCOUNTER — Encounter: Payer: Self-pay | Admitting: Internal Medicine

## 2011-07-19 ENCOUNTER — Ambulatory Visit (INDEPENDENT_AMBULATORY_CARE_PROVIDER_SITE_OTHER): Payer: 59 | Admitting: Internal Medicine

## 2011-07-19 DIAGNOSIS — L570 Actinic keratosis: Secondary | ICD-10-CM
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
## 2011-08-06 IMAGING — CR DG ORBITS FOR FOREIGN BODY
2 series · 2 of 2 positions shown · non-contrast
Comparison: CT 01/02/2009

CLINICAL DATA: Metal exposure.  Assess for orbital foreign object.

ORBITS FOR FOREIGN BODY - 2 VIEW

[w waters (1 of 2)]
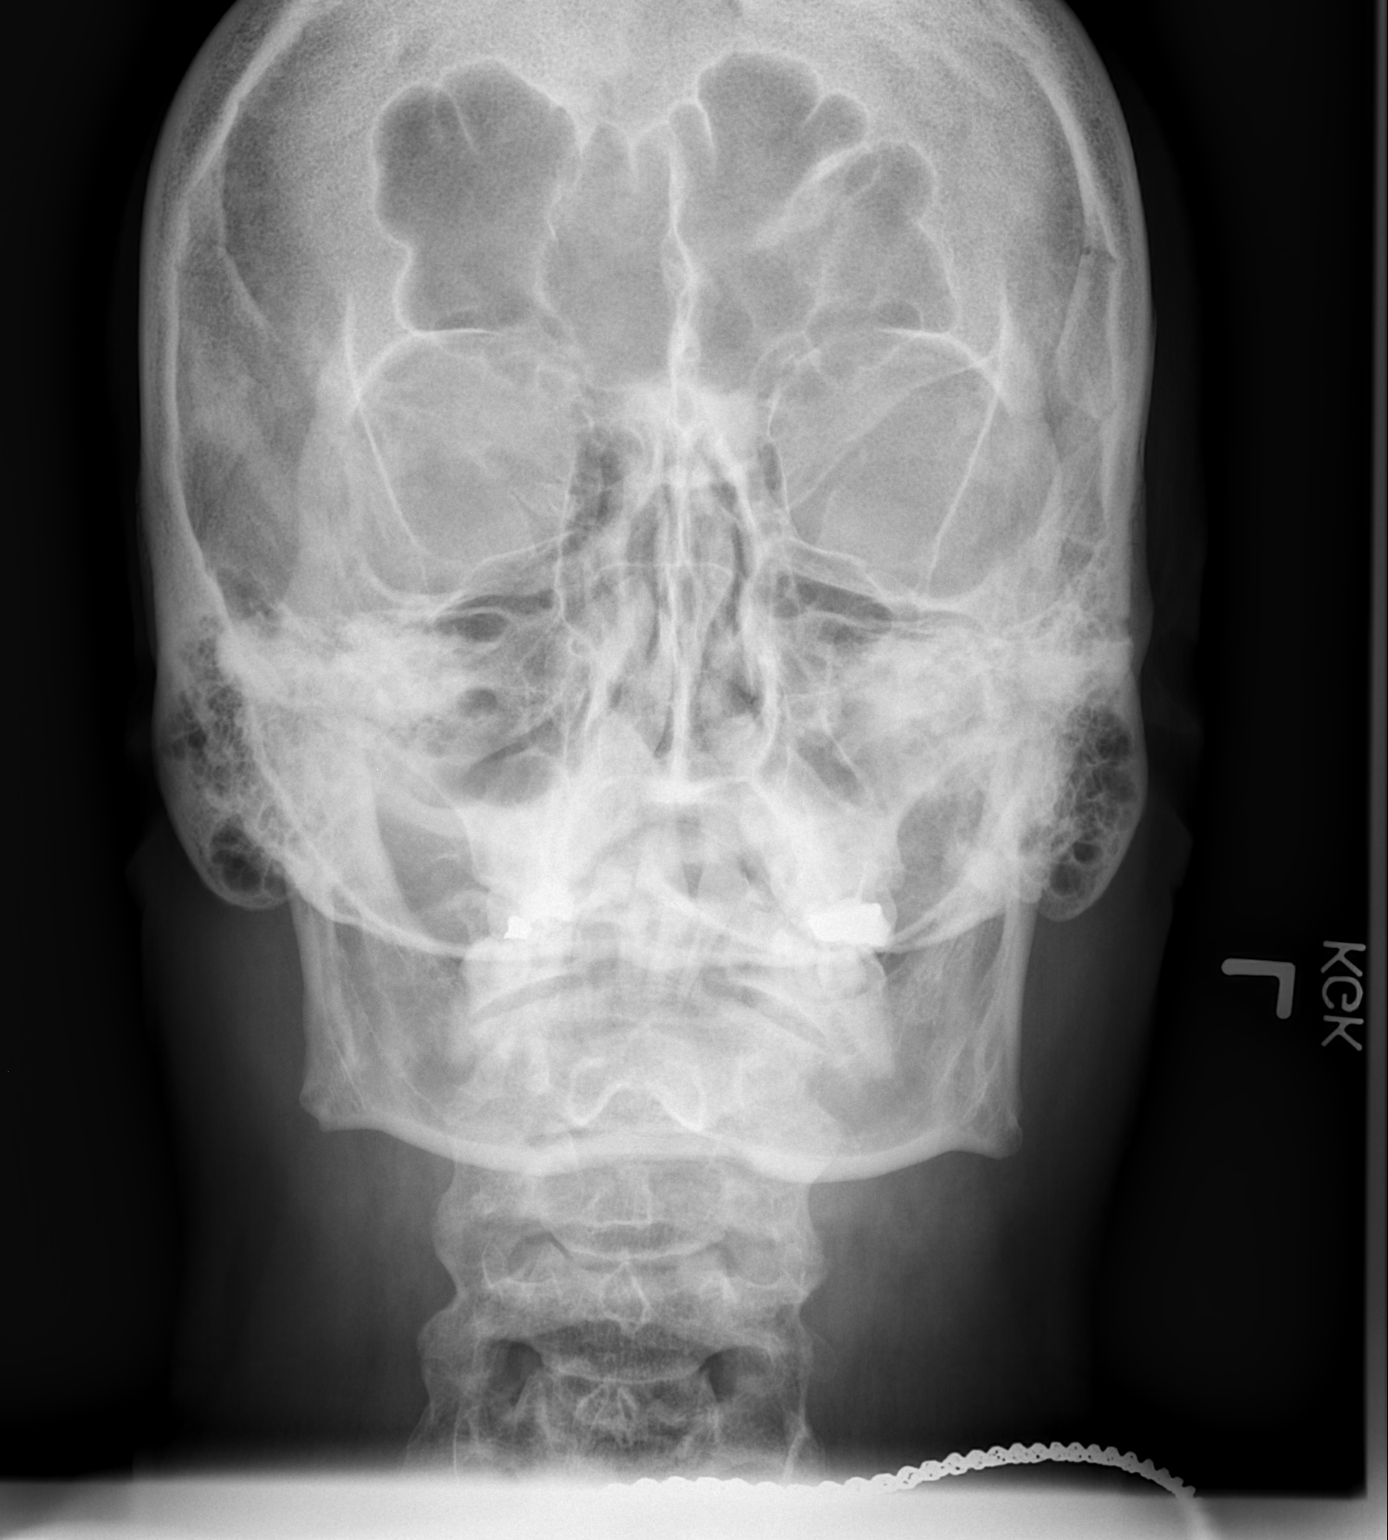

[w waters (2 of 2)]
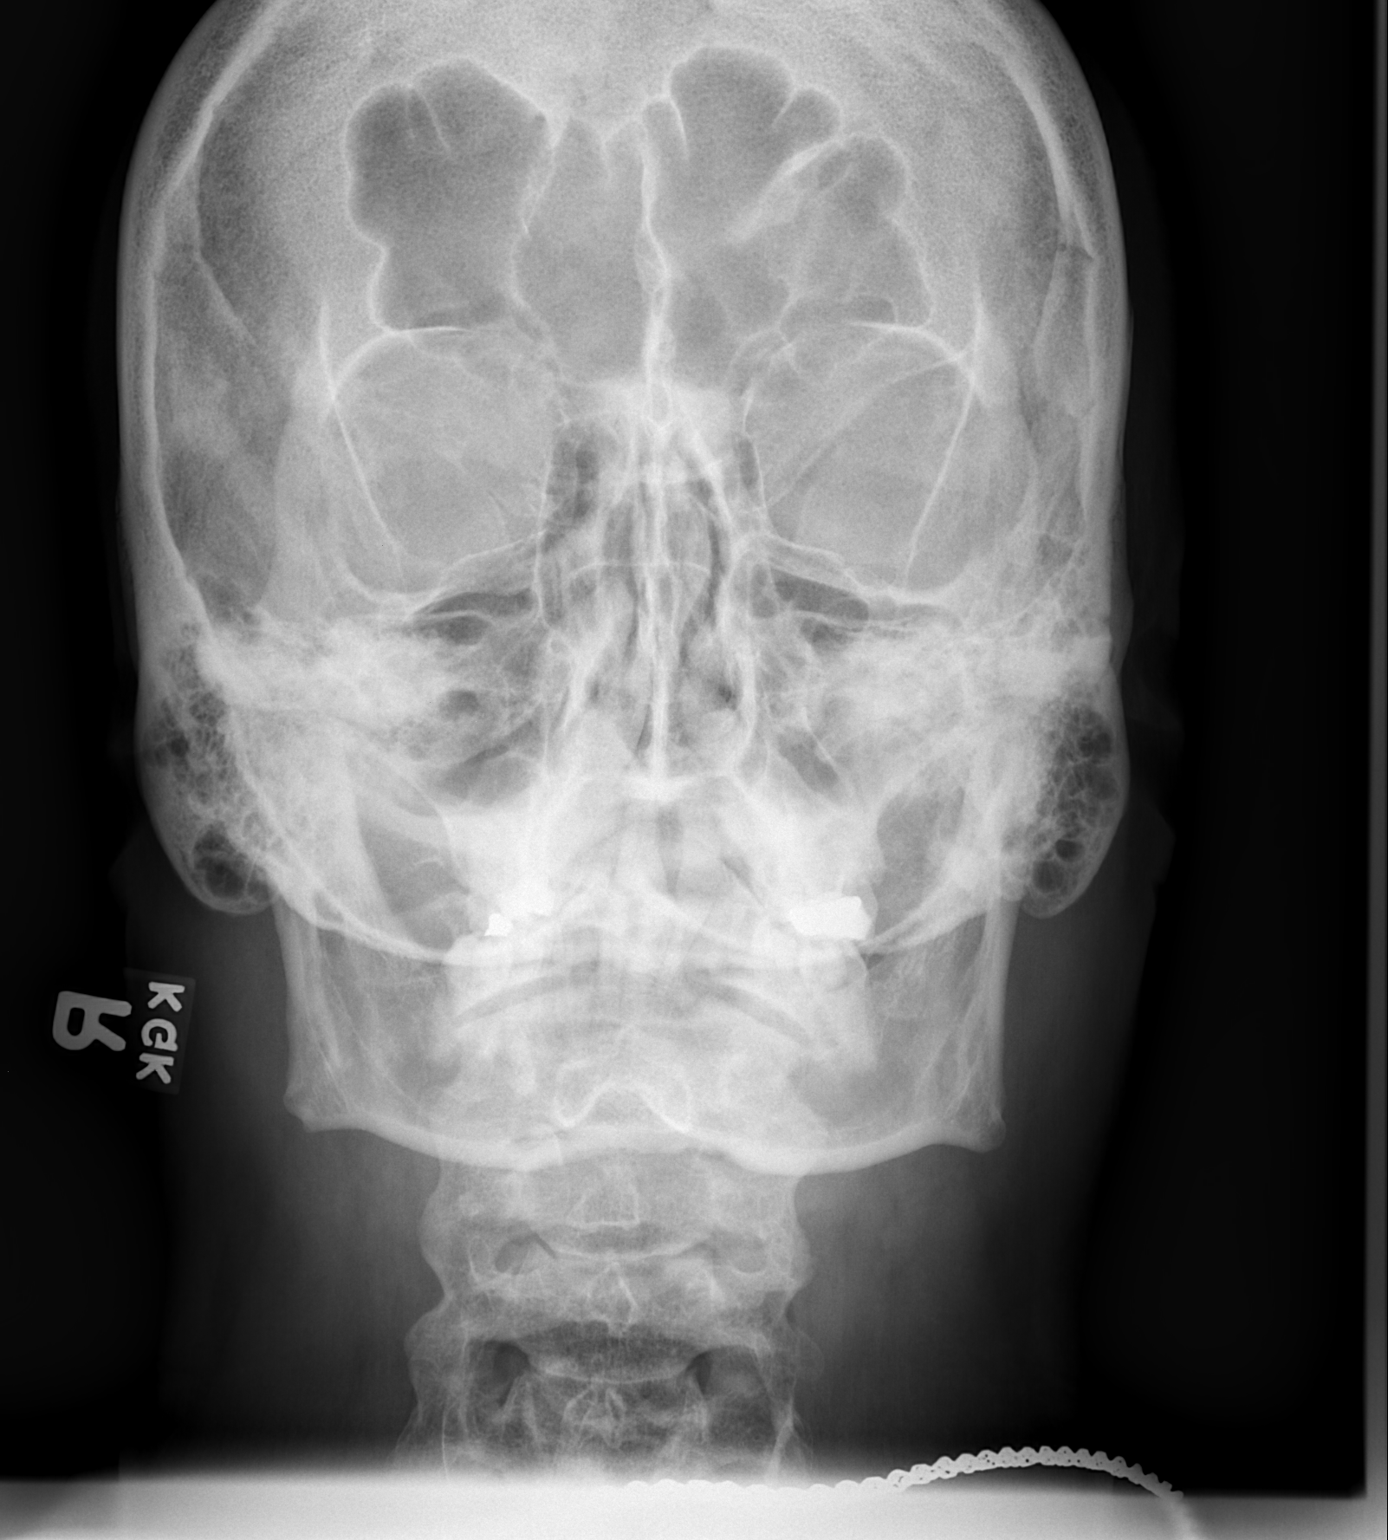

[2 of 2 positions shown; findings below may reference images not displayed]

FINDINGS: No sign of radiopaque foreign object in the region of
either orbit.  Sinuses are clear.
IMPRESSION: No sign of radiopaque foreign object in the region of either orbit.

## 2011-09-15 ENCOUNTER — Other Ambulatory Visit (INDEPENDENT_AMBULATORY_CARE_PROVIDER_SITE_OTHER): Payer: 59

## 2011-09-15 DIAGNOSIS — E1165 Type 2 diabetes mellitus with hyperglycemia: Secondary | ICD-10-CM

## 2011-09-15 LAB — HEMOGLOBIN A1C: Hgb A1c MFr Bld: 7.1 % — ABNORMAL HIGH (ref 4.6–6.5)

## 2011-09-22 ENCOUNTER — Encounter: Payer: Self-pay | Admitting: Internal Medicine

## 2011-09-22 ENCOUNTER — Ambulatory Visit (INDEPENDENT_AMBULATORY_CARE_PROVIDER_SITE_OTHER): Payer: 59 | Admitting: Internal Medicine

## 2011-09-22 VITALS — BP 136/80 | HR 76 | Temp 98.2°F | Resp 16 | Ht 72.0 in | Wt 230.0 lb

## 2011-09-22 DIAGNOSIS — F329 Major depressive disorder, single episode, unspecified: Secondary | ICD-10-CM

## 2011-09-22 DIAGNOSIS — E785 Hyperlipidemia, unspecified: Secondary | ICD-10-CM

## 2011-09-22 DIAGNOSIS — E1165 Type 2 diabetes mellitus with hyperglycemia: Secondary | ICD-10-CM

## 2011-09-22 DIAGNOSIS — K5730 Diverticulosis of large intestine without perforation or abscess without bleeding: Secondary | ICD-10-CM

## 2011-09-22 DIAGNOSIS — E1169 Type 2 diabetes mellitus with other specified complication: Secondary | ICD-10-CM

## 2011-09-22 DIAGNOSIS — I10 Essential (primary) hypertension: Secondary | ICD-10-CM
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
## 2011-09-22 NOTE — Patient Instructions (Signed)
Watch calories Set goal for weight loss of 8-10 lbs

## 2012-01-26 ENCOUNTER — Other Ambulatory Visit: Payer: 59

## 2012-02-02 ENCOUNTER — Ambulatory Visit: Payer: 59 | Admitting: Internal Medicine

## 2012-02-26 ENCOUNTER — Telehealth: Payer: Self-pay | Admitting: Family Medicine

## 2012-02-26 DIAGNOSIS — F419 Anxiety disorder, unspecified: Secondary | ICD-10-CM

## 2012-02-26 MED ORDER — SERTRALINE HCL 25 MG PO TABS: 25.0000 mg | ORAL_TABLET | Freq: Every day | ORAL | Status: DC

## 2012-02-26 NOTE — Telephone Encounter (Signed)
done

## 2012-02-26 NOTE — Telephone Encounter (Signed)
Requesting refill of Zoloft be called in to the [facility] on [address]

## 2012-07-22 ENCOUNTER — Other Ambulatory Visit: Payer: Self-pay | Admitting: *Deleted

## 2012-07-22 MED ORDER — SITAGLIPTIN PHOS-METFORMIN HCL 50-1000 MG PO TABS: 1.0000 | ORAL_TABLET | Freq: Two times a day (BID) | ORAL | Status: DC

## 2012-11-28 ENCOUNTER — Other Ambulatory Visit: Payer: Self-pay | Admitting: Internal Medicine

## 2012-12-12 ENCOUNTER — Other Ambulatory Visit: Payer: Self-pay | Admitting: *Deleted

## 2012-12-19 ENCOUNTER — Telehealth: Payer: Self-pay | Admitting: *Deleted

## 2012-12-19 MED ORDER — LINAGLIPTIN-METFORMIN HCL 2.5-1000 MG PO TABS: 2.5000 mg | ORAL_TABLET | Freq: Every day | ORAL | Status: DC

## 2012-12-19 NOTE — Telephone Encounter (Signed)
Received message from [facility]- need to change janumet-- per dr Lovell Sheehan can change to janudueto 2.5/`1000 qd Left message on machine For pt

## 2013-01-10 ENCOUNTER — Ambulatory Visit (INDEPENDENT_AMBULATORY_CARE_PROVIDER_SITE_OTHER): Payer: 59 | Admitting: Family Medicine

## 2013-01-10 VITALS — BP 124/84 | HR 68 | Temp 98.2°F | Resp 16 | Ht 70.0 in | Wt 232.0 lb

## 2013-01-10 DIAGNOSIS — Z0289 Encounter for other administrative examinations: Secondary | ICD-10-CM

## 2013-01-10 DIAGNOSIS — E119 Type 2 diabetes mellitus without complications: Secondary | ICD-10-CM

## 2013-01-10 LAB — GLUCOSE, POCT (MANUAL RESULT ENTRY): POC Glucose: 126 mg/dl — AB (ref 70–99)

## 2013-01-10 NOTE — Progress Notes (Signed)
  Subjective:    Patient ID: Dylan Blake, male    DOB: 03/02/1961, 52 y.o.   MRN: [id number]  HPI Dylan Blake is a 53 y.o. male  Here for DOT physical. . Primary care Dr. Darryll Capers -last physical July of last year. Planning on physical there with primary provider this year.   No prior hx of DOT exam, but [place] in [id number]. Works for [employer]. 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  DM2-  Takes Janumet - 2.04/999 - twice per day.  Misses doses frequently - takes meds mostly once per day.   Does not check home blood sugars.  Last eval at primary care provider - 09/2011. No hx of symptomatic low blood sugars.   Results for orders placed in visit on 09/15/11  HEMOGLOBIN A1C      Component Value Range   Hemoglobin A1C 7.1 (*) 4.6 - 6.5 %   HTN - on Benicar for HTN.  No chest pains, lightheadedness or dizziness with HTN meds.   Review of Systems As above.     Objective:   Physical Exam  Vitals reviewed. Constitutional: He is oriented to person, place, and time. He appears well-developed and well-nourished.  HENT:  Head: Normocephalic and atraumatic.  Right Ear: External ear normal.  Left Ear: External ear normal.  Mouth/Throat: Oropharynx is clear and moist.  Eyes: Conjunctivae normal and EOM are normal. Pupils are equal, round, and reactive to light.  Neck: Normal range of motion. Neck supple. No thyromegaly present.  Cardiovascular: Normal rate, regular rhythm, normal heart sounds and intact distal pulses.   Pulmonary/Chest: Effort normal and breath sounds normal. No respiratory distress. He has no wheezes.  Abdominal: Soft. He exhibits no distension. There is no tenderness. Hernia confirmed negative in the right inguinal area and confirmed negative in the left inguinal area.  Musculoskeletal: Normal range of motion. He exhibits no edema and no tenderness.  Lymphadenopathy:    He  has no cervical adenopathy.  Neurological: He is alert and oriented to person, place, and time. He has normal reflexes.       nvi distally. Microfilament testing wnl.   Skin: Skin is warm and dry.  Psychiatric: He has a normal mood and affect. His behavior is normal.    Urinalysis - screen: neg protein, neg blood, positive for sugar - 250 Results for orders placed in visit on 01/10/13  GLUCOSE, POCT (MANUAL RESULT ENTRY)      Component Value Range   POC Glucose 126 (*) 70 - 99 mg/dl      Assessment & Plan:  Dylan Blake is a 52 y.o. male 1. Diabetes mellitus  POCT glucose (manual entry)  2. Health examination of defined subpopulation     DOT PE - diabetes without recent eval at primary care provider. Noted glucose in office, but needs follow up with primary provider including A1c and to discuss BP/other medical problems. Understanding discussed. 6 month DOT card.   Patient Instructions  Call your doctor to follow up your diabetes.  It is necessary to be evaluated by your doctor at least every 6 months for diabetes. Your DOT card will be good for 6 months this time.

## 2013-01-10 NOTE — Patient Instructions (Signed)
Call your doctor to follow up your diabetes.  It is necessary to be evaluated by your doctor at least every 6 months for diabetes. Your DOT card will be good for 6 months this time.

## 2013-01-14 ENCOUNTER — Encounter: Payer: Self-pay | Admitting: Family Medicine

## 2013-02-20 ENCOUNTER — Encounter: Payer: Self-pay | Admitting: Internal Medicine

## 2013-02-20 ENCOUNTER — Ambulatory Visit (INDEPENDENT_AMBULATORY_CARE_PROVIDER_SITE_OTHER): Payer: 59 | Admitting: Internal Medicine

## 2013-02-20 VITALS — BP 140/80 | HR 74 | Temp 98.4°F | Resp 20 | Wt 238.0 lb

## 2013-02-20 DIAGNOSIS — L723 Sebaceous cyst: Secondary | ICD-10-CM

## 2013-02-20 DIAGNOSIS — L089 Local infection of the skin and subcutaneous tissue, unspecified: Secondary | ICD-10-CM

## 2013-02-20 MED ORDER — AMOXICILLIN-POT CLAVULANATE 875-125 MG PO TABS: 1.0000 | ORAL_TABLET | Freq: Two times a day (BID) | ORAL | Status: DC

## 2013-02-20 NOTE — Progress Notes (Signed)
  Subjective:    Patient ID: Dylan Blake, male    DOB: 01-13-1961, 52 y.o.   MRN: [id number]  HPI 52 year old patient who has a long history of sebaceous cyst in the posterior neck area. Over the past several days this has become much larger and painful. Denies any fever or constitutional complaints. He has been using ibuprofen for pain control    Review of Systems  Skin: Positive for wound.       Objective:   Physical Exam  Constitutional: He appears well-developed and well-nourished. No distress.  Skin:  A very large 6 x 8 cm inflamed sebaceous cyst involving almost the entire posterior neck region. Very erythematous and fluctuant          Assessment & Plan:    Infected sebaceous cyst Procedure note-  after skin prepped with Betadine and alcohol swab, the skin of the posterior neck region was locally anesthetized with 1% Xylocaine. A 10-to 12 mm incision was made in the central part of the inflammatory mass.  The cyst was drained and decompressed and packed with  Gauze. Blunt dissection was performed with forceps; the lining of the cyst wall was removed  Antibiotic ointment applied and the wound dressed. Wound care instructions dispensed. He will return in 4 days to reassess the wound and to remove the packing

## 2013-02-20 NOTE — Patient Instructions (Addendum)

## 2013-02-21 ENCOUNTER — Ambulatory Visit: Payer: 59 | Admitting: Internal Medicine

## 2013-02-24 ENCOUNTER — Encounter: Payer: Self-pay | Admitting: Internal Medicine

## 2013-02-24 ENCOUNTER — Ambulatory Visit (INDEPENDENT_AMBULATORY_CARE_PROVIDER_SITE_OTHER): Payer: 59 | Admitting: Internal Medicine

## 2013-02-24 VITALS — BP 140/80 | HR 62 | Temp 98.0°F | Resp 18 | Wt 237.0 lb

## 2013-02-24 DIAGNOSIS — L723 Sebaceous cyst: Secondary | ICD-10-CM

## 2013-02-24 NOTE — Patient Instructions (Signed)
Call or return to clinic prn if these symptoms worsen or fail to improve as anticipated.  Take your antibiotic as prescribed until ALL of it is gone, but stop if you develop a rash, swelling, or any side effects of the medication.  Contact our office as soon as possible if  there are side effects of the medication. 

## 2013-02-24 NOTE — Progress Notes (Signed)
  Subjective:    Patient ID: Dylan Blake, male    DOB: [birth year], 52 y.o.   MRN: [id number]  HPI  52 year old patient who is seen today in followup. 4 days ago the patient had a large infected sebaceous cyst involving the posterior neck I indeed.  Packing was left in place and the patient returns today in followup. He was placed on oral antibiotics which she has tolerated well.    Review of Systems     Objective:   Physical Exam  Skin:  Minimal erythema and induration noted about the incision in his mid posterior neck region. Packing was removed Antibiotic ointment  and light dressing  applied          Assessment & Plan:   Status post I&D of a large infected sebaceous cyst posterior neck. Packing removed Patient will complete antibiotics We'll report any clinical worsening such as pain swelling fever or drainage

## 2013-04-29 ENCOUNTER — Other Ambulatory Visit: Payer: Self-pay | Admitting: *Deleted

## 2013-04-29 ENCOUNTER — Telehealth: Payer: Self-pay | Admitting: Internal Medicine

## 2013-04-29 MED ORDER — LINAGLIPTIN-METFORMIN HCL 2.5-1000 MG PO TABS: 2.5000 mg | ORAL_TABLET | Freq: Every day | ORAL | Status: DC

## 2013-04-29 NOTE — Telephone Encounter (Signed)
PT called and stated that he is out of his "diabetic medication". He stated that he has been taking Janumet, but Dr. Lovell Sheehan changed that to something new. PT stated  he didn't want it called in until he ran out of his current medication, and that time has came. Please assist.

## 2013-04-29 NOTE — Telephone Encounter (Signed)
Sent to pharmacy listed.

## 2013-08-26 ENCOUNTER — Telehealth: Payer: Self-pay | Admitting: Internal Medicine

## 2013-08-26 NOTE — Telephone Encounter (Signed)
Pt would like to know if Dr Kirtland Bouchard will do his CPE on a Friday as soon as there is an opening.  Is that OK w/ you DR K? Is that ok w/ you Dr Lovell Sheehan?

## 2013-08-26 NOTE — Telephone Encounter (Signed)
Ok with dr j

## 2013-08-26 NOTE — Telephone Encounter (Signed)
lmom 

## 2013-08-26 NOTE — Telephone Encounter (Signed)
ok 

## 2013-08-29 ENCOUNTER — Other Ambulatory Visit (INDEPENDENT_AMBULATORY_CARE_PROVIDER_SITE_OTHER): Payer: 59

## 2013-08-29 DIAGNOSIS — Z0000 Encounter for general adult medical examination without abnormal findings: Secondary | ICD-10-CM

## 2013-08-29 LAB — POCT URINALYSIS DIPSTICK
Blood, UA: NEGATIVE
Glucose, UA: NEGATIVE
Nitrite, UA: NEGATIVE
Urobilinogen, UA: 2
pH, UA: 7

## 2013-08-29 LAB — HEMOGLOBIN A1C: Hgb A1c MFr Bld: 7.2 % — ABNORMAL HIGH (ref 4.6–6.5)

## 2013-08-29 LAB — CBC WITH DIFFERENTIAL/PLATELET
Basophils Relative: 0.5 % (ref 0.0–3.0)
Eosinophils Relative: 2 % (ref 0.0–5.0)
Hemoglobin: 14.5 g/dL (ref 13.0–17.0)
MCV: 91 fl (ref 78.0–100.0)
Monocytes Absolute: 0.7 10*3/uL (ref 0.1–1.0)
Neutro Abs: 2.9 10*3/uL (ref 1.4–7.7)
Neutrophils Relative %: 60 % (ref 43.0–77.0)
RBC: 4.64 Mil/uL (ref 4.22–5.81)
WBC: 4.9 10*3/uL (ref 4.5–10.5)

## 2013-08-29 LAB — HEPATIC FUNCTION PANEL
ALT: 32 U/L (ref 0–53)
AST: 38 U/L — ABNORMAL HIGH (ref 0–37)
Albumin: 3.8 g/dL (ref 3.5–5.2)
Alkaline Phosphatase: 58 U/L (ref 39–117)
Bilirubin, Direct: 0.2 mg/dL (ref 0.0–0.3)
Total Bilirubin: 1 mg/dL (ref 0.3–1.2)
Total Protein: 7.1 g/dL (ref 6.0–8.3)

## 2013-08-29 LAB — LIPID PANEL
HDL: 49.1 mg/dL (ref >39.00)
LDL Cholesterol: 134 mg/dL — ABNORMAL HIGH (ref 0–99)
Total CHOL/HDL Ratio: 4
Triglycerides: 65 mg/dL (ref 0.0–149.0)

## 2013-08-29 LAB — BASIC METABOLIC PANEL
Chloride: 104 mEq/L (ref 96–112)
Creatinine, Ser: 0.9 mg/dL (ref 0.4–1.5)
Potassium: 4.3 mEq/L (ref 3.5–5.1)
Sodium: 137 mEq/L (ref 135–145)

## 2013-09-05 ENCOUNTER — Ambulatory Visit (INDEPENDENT_AMBULATORY_CARE_PROVIDER_SITE_OTHER): Payer: 59 | Admitting: Internal Medicine

## 2013-09-05 ENCOUNTER — Other Ambulatory Visit: Payer: Self-pay | Admitting: *Deleted

## 2013-09-05 ENCOUNTER — Encounter: Payer: Self-pay | Admitting: Internal Medicine

## 2013-09-05 VITALS — BP 150/90 | HR 80 | Temp 98.1°F | Resp 20 | Ht 70.5 in | Wt 229.0 lb

## 2013-09-05 DIAGNOSIS — E785 Hyperlipidemia, unspecified: Secondary | ICD-10-CM

## 2013-09-05 DIAGNOSIS — E1165 Type 2 diabetes mellitus with hyperglycemia: Secondary | ICD-10-CM

## 2013-09-05 DIAGNOSIS — I10 Essential (primary) hypertension: Secondary | ICD-10-CM

## 2013-09-05 DIAGNOSIS — Z0000 Encounter for general adult medical examination without abnormal findings: Secondary | ICD-10-CM

## 2013-09-05 DIAGNOSIS — IMO0002 Reserved for concepts with insufficient information to code with codable children: Secondary | ICD-10-CM

## 2013-09-05 MED ORDER — LINAGLIPTIN-METFORMIN HCL 2.5-1000 MG PO TABS: 2.5000 mg | ORAL_TABLET | Freq: Every day | ORAL | Status: DC

## 2013-09-05 MED ORDER — LINAGLIPTIN-METFORMIN HCL 2.5-1000 MG PO TABS: 2.0000 | ORAL_TABLET | Freq: Every day | ORAL | Status: DC

## 2013-09-05 NOTE — Patient Instructions (Signed)
Please check your hemoglobin A1c every 3 months    It is important that you exercise regularly, at least 20 minutes 3 to 4 times per week.  If you develop chest pain or shortness of breath seek  medical attention.  You need to lose weight.  Consider a lower calorie diet and regular   Please see your eye doctor yearly to check for diabetic eye damage  Schedule your colonoscopy to help detect colon cancer.
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
## 2013-11-10 ENCOUNTER — Encounter: Payer: Self-pay | Admitting: Internal Medicine

## 2013-12-01 ENCOUNTER — Other Ambulatory Visit: Payer: Self-pay | Admitting: Internal Medicine

## 2014-05-20 ENCOUNTER — Other Ambulatory Visit: Payer: Self-pay | Admitting: Internal Medicine

## 2014-05-21 ENCOUNTER — Telehealth: Payer: Self-pay

## 2014-05-21 NOTE — Telephone Encounter (Signed)
Pt's spouse called and states pt needs a refill of JENTADUETO 2.04-999 MG TABS.  Pt called yesterday but the office was closed.

## 2014-05-21 NOTE — Telephone Encounter (Signed)
Called the pharmacy and spoke with Alyse Low and she states medication was not in stock.  It was ordered and should be available to patient after 2 pm today.  Called and left a vm for patient on cell phone voicemail.

## 2014-06-26 ENCOUNTER — Ambulatory Visit (INDEPENDENT_AMBULATORY_CARE_PROVIDER_SITE_OTHER): Payer: 59 | Admitting: Internal Medicine

## 2014-06-26 ENCOUNTER — Encounter: Payer: Self-pay | Admitting: Internal Medicine

## 2014-06-26 VITALS — BP 128/80 | HR 68 | Temp 98.3°F | Resp 20 | Ht 70.5 in | Wt 226.0 lb

## 2014-06-26 DIAGNOSIS — IMO0002 Reserved for concepts with insufficient information to code with codable children: Secondary | ICD-10-CM

## 2014-06-26 DIAGNOSIS — I10 Essential (primary) hypertension: Secondary | ICD-10-CM

## 2014-06-26 DIAGNOSIS — R319 Hematuria, unspecified: Secondary | ICD-10-CM

## 2014-06-26 DIAGNOSIS — E1169 Type 2 diabetes mellitus with other specified complication: Secondary | ICD-10-CM

## 2014-06-26 DIAGNOSIS — E1165 Type 2 diabetes mellitus with hyperglycemia: Secondary | ICD-10-CM

## 2014-06-26 LAB — POCT URINALYSIS DIPSTICK
Bilirubin, UA: NEGATIVE
KETONES UA: NEGATIVE
Leukocytes, UA: NEGATIVE
Nitrite, UA: NEGATIVE
PH UA: 6
PROTEIN UA: NEGATIVE
RBC UA: NEGATIVE
SPEC GRAV UA: 1.02
UROBILINOGEN UA: 1

## 2014-06-26 LAB — HEMOGLOBIN A1C: Hgb A1c MFr Bld: 7.2 % — ABNORMAL HIGH (ref 4.6–6.5)

## 2014-06-26 NOTE — Patient Instructions (Signed)
Please check your hemoglobin A1c every 3 months  Limit your sodium (Salt) intake
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
## 2014-06-26 NOTE — Progress Notes (Signed)
Pre visit review using our clinic review tool, if applicable. No additional management support is needed unless otherwise documented below in the visit note. 

## 2014-06-27 ENCOUNTER — Telehealth: Payer: Self-pay | Admitting: Internal Medicine

## 2014-06-27 NOTE — Telephone Encounter (Signed)
Relevant patient education mailed to patient.  

## 2014-09-04 ENCOUNTER — Other Ambulatory Visit (INDEPENDENT_AMBULATORY_CARE_PROVIDER_SITE_OTHER): Payer: 59

## 2014-09-04 DIAGNOSIS — E1165 Type 2 diabetes mellitus with hyperglycemia: Secondary | ICD-10-CM

## 2014-09-04 DIAGNOSIS — IMO0002 Reserved for concepts with insufficient information to code with codable children: Secondary | ICD-10-CM

## 2014-09-04 DIAGNOSIS — E1169 Type 2 diabetes mellitus with other specified complication: Principal | ICD-10-CM

## 2014-09-04 LAB — BASIC METABOLIC PANEL
BUN: 11 mg/dL (ref 6–23)
CALCIUM: 9.4 mg/dL (ref 8.4–10.5)
CHLORIDE: 104 meq/L (ref 96–112)
CO2: 27 meq/L (ref 19–32)
CREATININE: 0.9 mg/dL (ref 0.4–1.5)
GFR: 99.4 mL/min (ref >60.00)
GLUCOSE: 123 mg/dL — AB (ref 70–99)
Potassium: 5.1 mEq/L (ref 3.5–5.1)
Sodium: 139 mEq/L (ref 135–145)

## 2014-09-10 ENCOUNTER — Ambulatory Visit (INDEPENDENT_AMBULATORY_CARE_PROVIDER_SITE_OTHER): Payer: 59 | Admitting: Internal Medicine

## 2014-09-10 ENCOUNTER — Other Ambulatory Visit: Payer: 59

## 2014-09-10 ENCOUNTER — Encounter: Payer: Self-pay | Admitting: Internal Medicine

## 2014-09-10 VITALS — BP 120/82 | HR 76 | Temp 98.3°F | Ht 70.5 in | Wt 225.0 lb

## 2014-09-10 DIAGNOSIS — I10 Essential (primary) hypertension: Secondary | ICD-10-CM

## 2014-09-10 DIAGNOSIS — E119 Type 2 diabetes mellitus without complications: Secondary | ICD-10-CM

## 2014-09-10 DIAGNOSIS — Z0000 Encounter for general adult medical examination without abnormal findings: Secondary | ICD-10-CM

## 2014-09-10 LAB — CBC WITH DIFFERENTIAL/PLATELET
BASOS PCT: 0.5 % (ref 0.0–3.0)
Basophils Absolute: 0 10*3/uL (ref 0.0–0.1)
EOS PCT: 2.3 % (ref 0.0–5.0)
Eosinophils Absolute: 0.1 10*3/uL (ref 0.0–0.7)
HCT: 42.8 % (ref 39.0–52.0)
Hemoglobin: 14.6 g/dL (ref 13.0–17.0)
LYMPHS PCT: 29.2 % (ref 12.0–46.0)
Lymphs Abs: 1.3 10*3/uL (ref 0.7–4.0)
MCHC: 34 g/dL (ref 30.0–36.0)
MCV: 92.4 fl (ref 78.0–100.0)
MONO ABS: 0.6 10*3/uL (ref 0.1–1.0)
MONOS PCT: 13.7 % — AB (ref 3.0–12.0)
NEUTROS PCT: 54.3 % (ref 43.0–77.0)
Neutro Abs: 2.4 10*3/uL (ref 1.4–7.7)
Platelets: 102 10*3/uL — ABNORMAL LOW (ref 150.0–400.0)
RBC: 4.64 Mil/uL (ref 4.22–5.81)
RDW: 13.5 % (ref 11.5–15.5)
WBC: 4.3 10*3/uL (ref 4.0–10.5)

## 2014-09-10 LAB — LIPID PANEL
CHOLESTEROL: 203 mg/dL — AB (ref 0–200)
HDL: 49.7 mg/dL (ref >39.00)
LDL CALC: 127 mg/dL — AB (ref 0–99)
NonHDL: 153.3
TRIGLYCERIDES: 131 mg/dL (ref 0.0–149.0)
Total CHOL/HDL Ratio: 4
VLDL: 26.2 mg/dL (ref 0.0–40.0)

## 2014-09-10 MED ORDER — LISINOPRIL 20 MG PO TABS: 20.0000 mg | ORAL_TABLET | Freq: Every day | ORAL | Status: DC
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
## 2014-09-10 NOTE — Patient Instructions (Addendum)

## 2014-09-10 NOTE — Progress Notes (Signed)
Pre visit review using our clinic review tool, if applicable. No additional management support is needed unless otherwise documented below in the visit note. 

## 2014-09-11 LAB — MICROALBUMIN / CREATININE URINE RATIO
Creatinine,U: 197.8 mg/dL
Microalb Creat Ratio: 0.2 mg/g (ref 0.0–30.0)
Microalb, Ur: 0.4 mg/dL (ref 0.0–1.9)

## 2014-09-11 LAB — TSH: TSH: 1.67 u[IU]/mL (ref 0.35–4.50)

## 2014-09-11 LAB — PSA: PSA: 0.4 ng/mL (ref 0.10–4.00)

## 2014-10-06 ENCOUNTER — Telehealth: Payer: Self-pay | Admitting: Internal Medicine

## 2014-10-06 MED ORDER — SERTRALINE HCL 25 MG PO TABS: ORAL_TABLET | ORAL | Status: DC

## 2014-10-06 NOTE — Telephone Encounter (Signed)
[id number] , [place] [address] is requesting re-fill on sertraline (ZOLOFT) 25 MG tablet

## 2014-10-06 NOTE — Telephone Encounter (Signed)
Rx sent 

## 2014-10-20 ENCOUNTER — Encounter: Payer: Self-pay | Admitting: Internal Medicine

## 2015-02-05 ENCOUNTER — Ambulatory Visit: Payer: 59 | Admitting: Internal Medicine

## 2015-05-26 ENCOUNTER — Other Ambulatory Visit: Payer: Self-pay | Admitting: Internal Medicine

## 2015-05-27 ENCOUNTER — Telehealth: Payer: Self-pay | Admitting: Internal Medicine

## 2015-05-27 MED ORDER — LINAGLIPTIN-METFORMIN HCL 2.5-1000 MG PO TABS: 1.0000 | ORAL_TABLET | Freq: Every day | ORAL | Status: DC

## 2015-05-27 NOTE — Telephone Encounter (Signed)
Pt request refill of the following: JENTADUETO 2.04-999 MG TABS      Phamacy:  [facility] [address]

## 2015-05-27 NOTE — Telephone Encounter (Signed)
Rx sent to pharmacy   

## 2015-05-28 ENCOUNTER — Other Ambulatory Visit: Payer: Self-pay | Admitting: Internal Medicine

## 2015-06-09 ENCOUNTER — Other Ambulatory Visit: Payer: Self-pay | Admitting: Internal Medicine

## 2016-01-07 ENCOUNTER — Emergency Department (HOSPITAL_COMMUNITY)
Admission: EM | Admit: 2016-01-07 | Discharge: 2016-01-08 | Disposition: A | Discharge status: Home / Self Care | Payer: Commercial Managed Care - PPO | Attending: Emergency Medicine | Admitting: Emergency Medicine

## 2016-01-07 ENCOUNTER — Emergency Department (HOSPITAL_COMMUNITY): Payer: Commercial Managed Care - PPO

## 2016-01-07 ENCOUNTER — Encounter (HOSPITAL_COMMUNITY): Payer: Self-pay | Admitting: Emergency Medicine

## 2016-01-07 DIAGNOSIS — E785 Hyperlipidemia, unspecified: Secondary | ICD-10-CM | POA: Insufficient documentation

## 2016-01-07 DIAGNOSIS — I10 Essential (primary) hypertension: Secondary | ICD-10-CM | POA: Insufficient documentation

## 2016-01-07 DIAGNOSIS — R0789 Other chest pain: Secondary | ICD-10-CM | POA: Diagnosis not present

## 2016-01-07 DIAGNOSIS — Z8719 Personal history of other diseases of the digestive system: Secondary | ICD-10-CM | POA: Diagnosis not present

## 2016-01-07 DIAGNOSIS — E119 Type 2 diabetes mellitus without complications: Secondary | ICD-10-CM | POA: Insufficient documentation

## 2016-01-07 DIAGNOSIS — Z7984 Long term (current) use of oral hypoglycemic drugs: Secondary | ICD-10-CM | POA: Insufficient documentation

## 2016-01-07 DIAGNOSIS — Z79899 Other long term (current) drug therapy: Secondary | ICD-10-CM | POA: Diagnosis not present

## 2016-01-07 DIAGNOSIS — R079 Chest pain, unspecified: Secondary | ICD-10-CM | POA: Diagnosis present

## 2016-01-07 LAB — CBC
HCT: 41.5 % (ref 39.0–52.0)
Hemoglobin: 14.9 g/dL (ref 13.0–17.0)
MCH: 31.8 pg (ref 26.0–34.0)
MCHC: 35.9 g/dL (ref 30.0–36.0)
MCV: 88.5 fL (ref 78.0–100.0)
PLATELETS: 108 10*3/uL — AB (ref 150–400)
RBC: 4.69 MIL/uL (ref 4.22–5.81)
RDW: 12.7 % (ref 11.5–15.5)
WBC: 6.7 10*3/uL (ref 4.0–10.5)

## 2016-01-07 LAB — BASIC METABOLIC PANEL
ANION GAP: 13 (ref 5–15)
BUN: 9 mg/dL (ref 6–20)
CALCIUM: 8.8 mg/dL — AB (ref 8.9–10.3)
CO2: 23 mmol/L (ref 22–32)
Chloride: 103 mmol/L (ref 101–111)
Creatinine, Ser: 0.9 mg/dL (ref 0.61–1.24)
GFR calc Af Amer: >60 mL/min (ref >60)
GLUCOSE: 155 mg/dL — AB (ref 65–99)
Potassium: 3.7 mmol/L (ref 3.5–5.1)
SODIUM: 139 mmol/L (ref 135–145)

## 2016-01-07 LAB — I-STAT TROPONIN, ED: TROPONIN I, POC: 0.01 ng/mL (ref 0.00–0.08)

## 2016-01-07 MED ORDER — IBUPROFEN 800 MG PO TABS
800.0000 mg | ORAL_TABLET | Freq: Once | ORAL | Status: AC
  Administered 2016-01-07: 800 mg via ORAL
  Filled 2016-01-07: qty 1

## 2016-01-07 NOTE — ED Notes (Signed)
C/o intermittent sharp L sided chest pain x 2 weeks with sob.  Denies nausea, vomiting, or any other symptoms.
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
## 2016-01-08 LAB — I-STAT TROPONIN, ED: TROPONIN I, POC: 0 ng/mL (ref 0.00–0.08)

## 2016-01-08 NOTE — Discharge Instructions (Signed)
Take motrin 800 mg every 6 hrs for pain.   Avoid heavy lifting for 3-4 days.   See your doctor.   Return to ER if you have severe chest pain, shortness of breath, palpitations.

## 2016-02-17 ENCOUNTER — Other Ambulatory Visit: Payer: Self-pay | Admitting: Internal Medicine

## 2016-05-12 ENCOUNTER — Other Ambulatory Visit (INDEPENDENT_AMBULATORY_CARE_PROVIDER_SITE_OTHER): Payer: Commercial Managed Care - PPO

## 2016-05-12 DIAGNOSIS — Z0000 Encounter for general adult medical examination without abnormal findings: Secondary | ICD-10-CM | POA: Diagnosis not present

## 2016-05-12 LAB — BASIC METABOLIC PANEL
BUN: 14 mg/dL (ref 6–23)
CALCIUM: 8.9 mg/dL (ref 8.4–10.5)
CO2: 27 mEq/L (ref 19–32)
CREATININE: 0.83 mg/dL (ref 0.40–1.50)
Chloride: 106 mEq/L (ref 96–112)
GFR: 102.88 mL/min (ref >60.00)
GLUCOSE: 159 mg/dL — AB (ref 70–99)
Potassium: 4.3 mEq/L (ref 3.5–5.1)
SODIUM: 141 meq/L (ref 135–145)

## 2016-05-12 LAB — POC URINALSYSI DIPSTICK (AUTOMATED)
Bilirubin, UA: NEGATIVE
Blood, UA: NEGATIVE
Ketones, UA: NEGATIVE
LEUKOCYTES UA: NEGATIVE
NITRITE UA: NEGATIVE
Spec Grav, UA: 1.02
pH, UA: 7.5

## 2016-05-12 LAB — CBC WITH DIFFERENTIAL/PLATELET
BASOS ABS: 0 10*3/uL (ref 0.0–0.1)
Basophils Relative: 0.6 % (ref 0.0–3.0)
EOS ABS: 0.1 10*3/uL (ref 0.0–0.7)
Eosinophils Relative: 2 % (ref 0.0–5.0)
HCT: 41.9 % (ref 39.0–52.0)
Hemoglobin: 14.5 g/dL (ref 13.0–17.0)
LYMPHS ABS: 1.1 10*3/uL (ref 0.7–4.0)
Lymphocytes Relative: 26.8 % (ref 12.0–46.0)
MCHC: 34.5 g/dL (ref 30.0–36.0)
MCV: 89.9 fl (ref 78.0–100.0)
MONO ABS: 0.5 10*3/uL (ref 0.1–1.0)
MONOS PCT: 12.4 % — AB (ref 3.0–12.0)
NEUTROS PCT: 58.2 % (ref 43.0–77.0)
Neutro Abs: 2.4 10*3/uL (ref 1.4–7.7)
PLATELETS: 99 10*3/uL — AB (ref 150.0–400.0)
RBC: 4.66 Mil/uL (ref 4.22–5.81)
RDW: 13.1 % (ref 11.5–15.5)
WBC: 4.1 10*3/uL (ref 4.0–10.5)

## 2016-05-12 LAB — LIPID PANEL
CHOLESTEROL: 168 mg/dL (ref 0–200)
HDL: 49.9 mg/dL (ref >39.00)
LDL Cholesterol: 104 mg/dL — ABNORMAL HIGH (ref 0–99)
NonHDL: 118.15
TRIGLYCERIDES: 69 mg/dL (ref 0.0–149.0)
Total CHOL/HDL Ratio: 3
VLDL: 13.8 mg/dL (ref 0.0–40.0)

## 2016-05-12 LAB — HEPATIC FUNCTION PANEL
ALBUMIN: 3.9 g/dL (ref 3.5–5.2)
ALK PHOS: 66 U/L (ref 39–117)
ALT: 26 U/L (ref 0–53)
AST: 32 U/L (ref 0–37)
BILIRUBIN DIRECT: 0.2 mg/dL (ref 0.0–0.3)
BILIRUBIN TOTAL: 1.1 mg/dL (ref 0.2–1.2)
TOTAL PROTEIN: 6.9 g/dL (ref 6.0–8.3)

## 2016-05-12 LAB — TSH: TSH: 1.43 u[IU]/mL (ref 0.35–4.50)

## 2016-05-12 LAB — MICROALBUMIN / CREATININE URINE RATIO
CREATININE, U: 195.2 mg/dL
Microalb Creat Ratio: 0.4 mg/g (ref 0.0–30.0)

## 2016-05-12 LAB — PSA: PSA: 0.38 ng/mL (ref 0.10–4.00)

## 2016-05-12 LAB — HEMOGLOBIN A1C: HEMOGLOBIN A1C: 8.6 % — AB (ref 4.6–6.5)

## 2016-05-19 ENCOUNTER — Ambulatory Visit (INDEPENDENT_AMBULATORY_CARE_PROVIDER_SITE_OTHER): Payer: Commercial Managed Care - PPO | Admitting: Internal Medicine

## 2016-05-19 ENCOUNTER — Encounter: Payer: Self-pay | Admitting: Internal Medicine

## 2016-05-19 VITALS — BP 140/70 | HR 65 | Temp 98.3°F | Ht 72.0 in | Wt 215.0 lb

## 2016-05-19 DIAGNOSIS — E785 Hyperlipidemia, unspecified: Secondary | ICD-10-CM | POA: Diagnosis not present

## 2016-05-19 DIAGNOSIS — E1165 Type 2 diabetes mellitus with hyperglycemia: Secondary | ICD-10-CM | POA: Insufficient documentation

## 2016-05-19 DIAGNOSIS — I10 Essential (primary) hypertension: Secondary | ICD-10-CM

## 2016-05-19 DIAGNOSIS — Z0000 Encounter for general adult medical examination without abnormal findings: Secondary | ICD-10-CM

## 2016-05-19 DIAGNOSIS — E119 Type 2 diabetes mellitus without complications: Secondary | ICD-10-CM | POA: Insufficient documentation

## 2016-05-19 MED ORDER — METFORMIN HCL 1000 MG PO TABS: 1000.0000 mg | ORAL_TABLET | Freq: Two times a day (BID) | ORAL | Status: DC

## 2016-05-19 MED ORDER — LINAGLIPTIN-METFORMIN HCL 2.5-1000 MG PO TABS: 1.0000 | ORAL_TABLET | Freq: Every day | ORAL | Status: DC

## 2016-05-19 MED ORDER — DULAGLUTIDE 1.5 MG/0.5ML ~~LOC~~ SOAJ: 1.5000 mg | SUBCUTANEOUS | Status: DC

## 2016-05-19 NOTE — Progress Notes (Signed)
Pre visit review using our clinic review tool, if applicable. No additional management support is needed unless otherwise documented below in the visit note. 

## 2016-05-19 NOTE — Patient Instructions (Signed)
Please check your hemoglobin A1c every 3 months  Limit your sodium (Salt) intake    It is important that you exercise regularly, at least 20 minutes 3 to 4 times per week.  If you develop chest pain or shortness of breath seek  medical attention.  You need to lose weight.  Consider a lower calorie diet and regular exercise.  Schedule your colonoscopy to help detect colon cancer.  Please see your eye doctor yearly to check for diabetic eye damage
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
## 2016-06-17 ENCOUNTER — Other Ambulatory Visit: Payer: Self-pay | Admitting: Internal Medicine

## 2016-06-19 ENCOUNTER — Encounter: Payer: Self-pay | Admitting: Internal Medicine

## 2016-06-19 NOTE — Telephone Encounter (Signed)
Rx refill sent to pharmacy. 

## 2016-08-18 ENCOUNTER — Ambulatory Visit: Payer: Commercial Managed Care - PPO | Admitting: Internal Medicine

## 2016-08-23 ENCOUNTER — Ambulatory Visit: Payer: Commercial Managed Care - PPO | Admitting: Internal Medicine

## 2016-10-09 ENCOUNTER — Ambulatory Visit: Payer: Commercial Managed Care - PPO | Admitting: Internal Medicine

## 2016-10-31 ENCOUNTER — Other Ambulatory Visit: Payer: Self-pay | Admitting: Internal Medicine

## 2016-11-10 ENCOUNTER — Telehealth: Payer: Self-pay | Admitting: Internal Medicine

## 2016-11-10 NOTE — Telephone Encounter (Signed)
Pt needs refill for sertraline (ZOLOFT)

## 2016-11-13 ENCOUNTER — Other Ambulatory Visit: Payer: Self-pay | Admitting: Internal Medicine

## 2017-02-19 ENCOUNTER — Other Ambulatory Visit: Payer: Self-pay | Admitting: Internal Medicine

## 2017-06-15 ENCOUNTER — Other Ambulatory Visit: Payer: Self-pay | Admitting: Internal Medicine

## 2017-06-15 NOTE — Telephone Encounter (Signed)
Left a message for pt to call office to schedule an appointment for a CPE and A1C check for his diabetes medication refill.

## 2017-06-26 ENCOUNTER — Other Ambulatory Visit: Payer: Self-pay | Admitting: Internal Medicine

## 2017-07-16 ENCOUNTER — Other Ambulatory Visit: Payer: Self-pay | Admitting: Internal Medicine

## 2017-10-23 ENCOUNTER — Other Ambulatory Visit: Payer: Self-pay | Admitting: Internal Medicine

## 2017-11-15 ENCOUNTER — Other Ambulatory Visit: Payer: Self-pay | Admitting: Internal Medicine

## 2017-12-18 ENCOUNTER — Encounter: Payer: Self-pay | Admitting: Internal Medicine

## 2017-12-18 ENCOUNTER — Ambulatory Visit (INDEPENDENT_AMBULATORY_CARE_PROVIDER_SITE_OTHER): Payer: Commercial Managed Care - PPO | Admitting: Internal Medicine

## 2017-12-18 VITALS — BP 118/62 | HR 97 | Temp 98.1°F | Ht 72.0 in | Wt 201.0 lb

## 2017-12-18 DIAGNOSIS — E119 Type 2 diabetes mellitus without complications: Secondary | ICD-10-CM

## 2017-12-18 DIAGNOSIS — Z0000 Encounter for general adult medical examination without abnormal findings: Secondary | ICD-10-CM

## 2017-12-18 LAB — POCT GLYCOSYLATED HEMOGLOBIN (HGB A1C): Hemoglobin A1C: 10.3

## 2017-12-18 MED ORDER — OLMESARTAN MEDOXOMIL 20 MG PO TABS: 20.0000 mg | ORAL_TABLET | Freq: Every day | ORAL | 4 refills | Status: DC

## 2017-12-18 MED ORDER — DULAGLUTIDE 1.5 MG/0.5ML ~~LOC~~ SOAJ: 1.5000 mg | SUBCUTANEOUS | 6 refills | Status: DC

## 2017-12-18 MED ORDER — ROSUVASTATIN CALCIUM 20 MG PO TABS: 20.0000 mg | ORAL_TABLET | Freq: Every day | ORAL | 3 refills | Status: DC

## 2017-12-18 MED ORDER — SERTRALINE HCL 50 MG PO TABS: 25.0000 mg | ORAL_TABLET | Freq: Every day | ORAL | 3 refills | Status: DC

## 2017-12-18 MED ORDER — SERTRALINE HCL 25 MG PO TABS: 25.0000 mg | ORAL_TABLET | Freq: Every day | ORAL | 3 refills | Status: DC

## 2017-12-18 MED ORDER — GLIPIZIDE ER 2.5 MG PO TB24: 2.5000 mg | ORAL_TABLET | Freq: Every day | ORAL | 4 refills | Status: DC

## 2017-12-18 MED ORDER — DAPAGLIFLOZIN PROPANEDIOL 10 MG PO TABS: 10.0000 mg | ORAL_TABLET | Freq: Every day | ORAL | 6 refills | Status: DC
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
## 2017-12-18 NOTE — Patient Instructions (Addendum)
Limit your sodium (Salt) intake    It is important that you exercise regularly, at least 20 minutes 3 to 4 times per week.  If you develop chest pain or shortness of breath seek  medical attention.  You need to lose weight.  Consider a lower calorie diet and regular exercise.  Please see your eye doctor yearly to check for diabetic eye damage  Schedule your colonoscopy to help detect colon cancer.  Diabetes Mellitus and Exercise Exercising regularly is important for your overall health, especially when you have diabetes (diabetes mellitus). Exercising is not only about losing weight. It has many health benefits, such as increasing muscle strength and bone density and reducing body fat and stress. This leads to improved fitness, flexibility, and endurance, all of which result in better overall health. Exercise has additional benefits for people with diabetes, including:  Reducing appetite.  Helping to lower and control blood glucose.  Lowering blood pressure.  Helping to control amounts of fatty substances (lipids) in the blood, such as cholesterol and triglycerides.  Helping the body to respond better to insulin (improving insulin sensitivity).  Reducing how much insulin the body needs.  Decreasing the risk for heart disease by: ? Lowering cholesterol and triglyceride levels. ? Increasing the levels of good cholesterol. ? Lowering blood glucose levels.  What is my activity plan? Your health care provider or certified diabetes educator can help you make a plan for the type and frequency of exercise (activity plan) that works for you. Make sure that you:  Do at least 150 minutes of moderate-intensity or vigorous-intensity exercise each week. This could be brisk walking, biking, or water aerobics. ? Do stretching and strength exercises, such as yoga or weightlifting, at least 2 times a week. ? Spread out your activity over at least 3 days of the week.  Get some form of physical  activity every day. ? Do not go more than 2 days in a row without some kind of physical activity. ? Avoid being inactive for more than 90 minutes at a time. Take frequent breaks to walk or stretch.  Choose a type of exercise or activity that you enjoy, and set realistic goals.  Start slowly, and gradually increase the intensity of your exercise over time.  What do I need to know about managing my diabetes?  Check your blood glucose before and after exercising. ? If your blood glucose is higher than 240 mg/dL (13.3 mmol/L) before you exercise, check your urine for ketones. If you have ketones in your urine, do not exercise until your blood glucose returns to normal.  Know the symptoms of low blood glucose (hypoglycemia) and how to treat it. Your risk for hypoglycemia increases during and after exercise. Common symptoms of hypoglycemia can include: ? Hunger. ? Anxiety. ? Sweating and feeling clammy. ? Confusion. ? Dizziness or feeling light-headed. ? Increased heart rate or palpitations. ? Blurry vision. ? Tingling or numbness around the mouth, lips, or tongue. ? Tremors or shakes. ? Irritability.  Keep a rapid-acting carbohydrate snack available before, during, and after exercise to help prevent or treat hypoglycemia.  Avoid injecting insulin into areas of the body that are going to be exercised. For example, avoid injecting insulin into: ? The arms, when playing tennis. ? The legs, when jogging.  Keep records of your exercise habits. Doing this can help you and your health care provider adjust your diabetes management plan as needed. Write down: ? Food that you eat before and after you  exercise. ? Blood glucose levels before and after you exercise. ? The type and amount of exercise you have done. ? When your insulin is expected to peak, if you use insulin. Avoid exercising at times when your insulin is peaking.  When you start a new exercise or activity, work with your health  care provider to make sure the activity is safe for you, and to adjust your insulin, medicines, or food intake as needed.  Drink plenty of water while you exercise to prevent dehydration or heat stroke. Drink enough fluid to keep your urine clear or pale yellow. This information is not intended to replace advice given to you by your health care provider. Make sure you discuss any questions you have with your health care provider. Document Released: 02/17/2004 Document Revised: 06/16/2016 Document Reviewed: 05/08/2016 Elsevier Interactive Patient Education  2018 [employer].  Diabetes Mellitus and Nutrition When you have diabetes (diabetes mellitus), it is very important to have healthy eating habits because your blood sugar (glucose) levels are greatly affected by what you eat and drink. Eating healthy foods in the appropriate amounts, at about the same times every day, can help you:  Control your blood glucose.  Lower your risk of heart disease.  Improve your blood pressure.  Reach or maintain a healthy weight.  Every person with diabetes is different, and each person has different needs for a meal plan. Your health care provider may recommend that you work with a diet and nutrition specialist (dietitian) to make a meal plan that is best for you. Your meal plan may vary depending on factors such as:  The calories you need.  The medicines you take.  Your weight.  Your blood glucose, blood pressure, and cholesterol levels.  Your activity level.  Other health conditions you have, such as heart or kidney disease.  How do carbohydrates affect me? Carbohydrates affect your blood glucose level more than any other type of food. Eating carbohydrates naturally increases the amount of glucose in your blood. Carbohydrate counting is a method for keeping track of how many carbohydrates you eat. Counting carbohydrates is important to keep your blood glucose at a healthy level, especially if you  use insulin or take certain oral diabetes medicines. It is important to know how many carbohydrates you can safely have in each meal. This is different for every person. Your dietitian can help you calculate how many carbohydrates you should have at each meal and for snack. Foods that contain carbohydrates include:  Bread, cereal, rice, pasta, and crackers.  Potatoes and corn.  Peas, beans, and lentils.  Milk and yogurt.  Fruit and juice.  Desserts, such as cakes, cookies, ice cream, and candy.  How does alcohol affect me? Alcohol can cause a sudden decrease in blood glucose (hypoglycemia), especially if you use insulin or take certain oral diabetes medicines. Hypoglycemia can be a life-threatening condition. Symptoms of hypoglycemia (sleepiness, dizziness, and confusion) are similar to symptoms of having too much alcohol. If your health care provider says that alcohol is safe for you, follow these guidelines:  Limit alcohol intake to no more than 1 drink per day for nonpregnant women and 2 drinks per day for men. One drink equals 12 oz of beer, 5 oz of wine, or 1 oz of hard liquor.  Do not drink on an empty stomach.  Keep yourself hydrated with water, diet soda, or unsweetened iced tea.  Keep in mind that regular soda, juice, and other mixers may contain a lot  of sugar and must be counted as carbohydrates.  What are tips for following this plan? Reading food labels  Start by checking the serving size on the label. The amount of calories, carbohydrates, fats, and other nutrients listed on the label are based on one serving of the food. Many foods contain more than one serving per package.  Check the total grams (g) of carbohydrates in one serving. You can calculate the number of servings of carbohydrates in one serving by dividing the total carbohydrates by 15. For example, if a food has 30 g of total carbohydrates, it would be equal to 2 servings of carbohydrates.  Check the  number of grams (g) of saturated and trans fats in one serving. Choose foods that have low or no amount of these fats.  Check the number of milligrams (mg) of sodium in one serving. Most people should limit total sodium intake to less than 2,300 mg per day.  Always check the nutrition information of foods labeled as "low-fat" or "nonfat". These foods may be higher in added sugar or refined carbohydrates and should be avoided.  Talk to your dietitian to identify your daily goals for nutrients listed on the label. Shopping  Avoid buying canned, premade, or processed foods. These foods tend to be high in fat, sodium, and added sugar.  Shop around the outside edge of the grocery store. This includes fresh fruits and vegetables, bulk grains, fresh meats, and fresh dairy. Cooking  Use low-heat cooking methods, such as baking, instead of high-heat cooking methods like deep frying.  Cook using healthy oils, such as olive, canola, or sunflower oil.  Avoid cooking with butter, cream, or high-fat meats. Meal planning  Eat meals and snacks regularly, preferably at the same times every day. Avoid going long periods of time without eating.  Eat foods high in fiber, such as fresh fruits, vegetables, beans, and whole grains. Talk to your dietitian about how many servings of carbohydrates you can eat at each meal.  Eat 4-6 ounces of lean protein each day, such as lean meat, chicken, fish, eggs, or tofu. 1 ounce is equal to 1 ounce of meat, chicken, or fish, 1 egg, or 1/4 cup of tofu.  Eat some foods each day that contain healthy fats, such as avocado, nuts, seeds, and fish. Lifestyle   Check your blood glucose regularly.  Exercise at least 30 minutes 5 or more days each week, or as told by your health care provider.  Take medicines as told by your health care provider.  Do not use any products that contain nicotine or tobacco, such as cigarettes and e-cigarettes. If you need help quitting, ask  your health care provider.  Work with a Social worker or diabetes educator to identify strategies to manage stress and any emotional and social challenges. What are some questions to ask my health care provider?  Do I need to meet with a diabetes educator?  Do I need to meet with a dietitian?  What number can I call if I have questions?  When are the best times to check my blood glucose? Where to find more information:  American Diabetes Association: diabetes.org/food-and-fitness/food  Academy of Nutrition and Dietetics: PokerClues.dk  [employer] of Diabetes and Digestive and Kidney Diseases (NIH): ContactWire.be Summary  A healthy meal plan will help you control your blood glucose and maintain a healthy lifestyle.  Working with a diet and nutrition specialist (dietitian) can help you make a meal plan that is best for you.  Keep in mind that carbohydrates and alcohol have immediate effects on your blood glucose levels. It is important to count carbohydrates and to use alcohol carefully. This information is not intended to replace advice given to you by your health care provider. Make sure you discuss any questions you have with your health care provider. Document Released: 08/24/2005 Document Revised: 01/01/2017 Document Reviewed: 01/01/2017 Elsevier Interactive Patient Education  2018 [place] for Eating Away From Home If You Have Diabetes Controlling your level of blood glucose, also known as blood sugar, can be challenging. It can be even more difficult when you do not prepare your own meals. The following tips can help you manage your diabetes when you eat away from home. Planning ahead Plan ahead if you know you will be eating away from home:  Ask your health care provider how to time meals and medicine if you are taking insulin.  Make a  list of restaurants near you that offer healthy choices. If they have a carry-out menu, take it home and plan what you will order ahead of time.  Look up the restaurant you want to eat at online. Many chain and fast-food restaurants list nutritional information online. Use this information to choose the healthiest options and to calculate how many carbohydrates will be in your meal.  Use a carbohydrate-counting book or mobile app to look up the carbohydrate content and serving size of the foods you want to eat.  Become familiar with serving sizes and learn to recognize how many servings are in a portion. This will allow you to estimate how many carbohydrates you can eat.  Free foods A "free food" is any food or drink that has less than 5 g of carbohydrates per serving. Free foods include:  Many vegetables.  Hard boiled eggs.  Nuts or seeds.  Olives.  Cheeses.  Meats.  These types of foods make good appetizer choices and are often available at salad bars. Lemon juice, vinegar, or a low-calorie salad dressing of fewer than 20 calories per serving can be used as a "free" salad dressing. Choices to reduce carbohydrates  Substitute nonfat sweetened yogurt with a sugar-free yogurt. Yogurt made from soy milk may also be used, but you will still want a sugar-free or plain option to choose a lower carbohydrate amount.  Ask your server to take away the bread basket or chips from your table.  Order fresh fruit. A salad bar often offers fresh fruit choices. Avoid canned fruit because it is usually packed in sugar or syrup.  Order a salad, and eat it without dressing. Or, create a "free" salad dressing.  Ask for substitutions. For example, instead of [place] fries, request an order of a vegetable such as salad, green beans, or broccoli. Other tips  If you take insulin, take the insulin once your food arrives to your table. This will ensure your insulin and food are timed correctly.  Ask your  server about the portion size before your order, and ask for a take-out box if the portion has more servings than you should have. When your food comes, leave the amount you should have on the plate, and put the rest in the take-out box.  Consider splitting an entree with someone and ordering a side salad. This information is not intended to replace advice given to you by your health care provider. Make sure you discuss any questions you have with your health care provider. Document Released: 11/27/2005 Document Revised: 05/04/2016 Document Reviewed:  02/24/2014 Elsevier Interactive Patient Education  2018 [employer].  Type 2 Diabetes Mellitus, Self Care, Adult When you have type 2 diabetes (type 2 diabetes mellitus), you must keep your blood sugar (glucose) under control. You can do this with:  Nutrition.  Exercise.  Lifestyle changes.  Medicines or insulin, if needed.  Support from your doctors and others.  How do I manage my blood sugar?  Check your blood sugar level every day, as often as told.  Call your doctor if your blood sugar is above your goal numbers for 2 tests in a row.  Have your A1c (hemoglobin A1c) level checked at least two times a year. Have it checked more often if your doctor tells you to. Your doctor will set treatment goals for you. Generally, you should have these blood sugar levels:  Before meals (preprandial): 80-130 mg/dL (4.4-7.2 mmol/L).  After meals (postprandial): lower than 180 mg/dL (10 mmol/L).  A1c level: less than 7%.  What do I need to know about high blood sugar? High blood sugar is called hyperglycemia. Know the signs of high blood sugar. Signs may include:  Feeling: ? Thirsty. ? Hungry. ? Very tired.  Needing to pee (urinate) more than usual.  Blurry vision.  What do I need to know about low blood sugar? Low blood sugar is called hypoglycemia. This is when blood sugar is at or below 70 mg/dL (3.9 mmol/L). Symptoms may  include:  Feeling: ? Hungry. ? Worried or nervous (anxious). ? Sweaty and clammy. ? Confused. ? Dizzy. ? Sleepy. ? Sick to your stomach (nauseous).  Having: ? A fast heartbeat (palpitations). ? A headache. ? A change in your vision. ? Jerky movements that you cannot control (seizure). ? Nightmares. ? Tingling or no feeling (numbness) around the mouth, lips, or tongue.  Having trouble with: ? Talking. ? Paying attention (concentrating). ? Moving (coordination). ? Sleeping.  Shaking.  Passing out (fainting).  Getting upset easily (irritability).  Treating low blood sugar  To treat low blood sugar, eat or drink something sugary right away. If you can think clearly and swallow safely, follow the 15:15 rule:  Take 15 grams of a fast-acting carb (carbohydrate). Some fast-acting carbs are: ? 1 tube of glucose gel. ? 3 sugar tablets (glucose pills). ? 6-8 pieces of hard candy. ? 4 oz (120 mL) of fruit juice. ? 4 oz (120 mL) regular (not diet) soda.  Check your blood sugar 15 minutes after you take the carb.  If your blood sugar is still at or below 70 mg/dL (3.9 mmol/L), take 15 grams of a carb again.  If your blood sugar does not go above 70 mg/dL (3.9 mmol/L) after 3 tries, get help right away.  After your blood sugar goes back to normal, eat a meal or a snack within 1 hour.  Treating very low blood sugar If your blood sugar is at or below 54 mg/dL (3 mmol/L), you have very low blood sugar (severe hypoglycemia). This is an emergency. Do not wait to see if the symptoms will go away. Get medical help right away. Call your local emergency services (911 in the [place] Do not drive yourself to the hospital. If you have very low blood sugar and you cannot eat or drink, you may need a glucagon shot (injection). A family member or friend should learn how to check your blood sugar and how to give you a glucagon shot. Ask your doctor if you need to have a glucagon shot kit at  home. What else is important to manage my diabetes? Medicine Follow these instructions about insulin and diabetes medicines:  Take them as told by your doctor.  Adjust them as told by your doctor.  Do not run out of them.  Having diabetes can raise your risk for other long-term conditions. These include heart or kidney disease. Your doctor may prescribe medicines to help prevent problems from diabetes. Food   Make healthy food choices. These include: ? Chicken, fish, egg whites, and beans. ? Oats, whole wheat, bulgur, brown rice, quinoa, and millet. ? Fresh fruits and vegetables. ? Low-fat dairy products. ? Nuts, avocado, olive oil, and canola oil.  Make a food plan with a specialist (dietitian).  Follow instructions from your doctor about what you cannot eat or drink.  Drink enough fluid to keep your pee (urine) clear or pale yellow.  Eat healthy snacks between healthy meals.  Keep track of carbs that you eat. Read food labels. Learn food serving sizes.  Follow your sick day plan when you cannot eat or drink normally. Make this plan with your doctor so it is ready to use. Activity  Exercise at least 3 times a week.  Do not go more than 2 days without exercising.  Talk with your doctor before you start a new exercise. Your doctor may need to adjust your insulin, medicines, or food. Lifestyle   Do not use any tobacco products. These include cigarettes, chewing tobacco, and e-cigarettes.If you need help quitting, ask your doctor.  Ask your doctor how much alcohol is safe for you.  Learn to deal with stress. If you need help with this, ask your doctor. Body care  Stay up to date with your shots (immunizations).  Have your eyes and feet checked by a doctor as often as told.  Check your skin and feet every day. Check for cuts, bruises, redness, blisters, or sores.  Brush your teeth and gums two times a day.  Floss at least one time a day.  Go to the dentist  least one time every 6 months.  Stay at a healthy weight. General instructions   Take over-the-counter and prescription medicines only as told by your doctor.  Share your diabetes care plan with: ? Your work or school. ? People you live with.  Check your pee (urine) for ketones: ? When you are sick. ? As told by your doctor.  Carry a card or wear jewelry that says that you have diabetes.  Ask your doctor: ? Do I need to meet with a diabetes educator? ? Where can I find a support group for people with diabetes?  Keep all follow-up visits as told by your doctor. This is important. Where to find more information: To learn more about diabetes, visit:  American Diabetes Association: www.diabetes.org  American Association of Diabetes Educators: www.diabeteseducator.org/patient-resources  This information is not intended to replace advice given to you by your health care provider. Make sure you discuss any questions you have with your health care provider. Document Released: 03/20/2016 Document Revised: 05/04/2016 Document Reviewed: 12/31/2015 Elsevier Interactive Patient Education  [employer].

## 2017-12-19 LAB — LIPID PANEL
CHOLESTEROL: 174 mg/dL (ref 0–200)
HDL: 57.6 mg/dL (ref >39.00)
LDL Cholesterol: 104 mg/dL — ABNORMAL HIGH (ref 0–99)
NONHDL: 116.83
Total CHOL/HDL Ratio: 3
Triglycerides: 64 mg/dL (ref 0.0–149.0)
VLDL: 12.8 mg/dL (ref 0.0–40.0)

## 2017-12-19 LAB — COMPREHENSIVE METABOLIC PANEL
ALBUMIN: 4.2 g/dL (ref 3.5–5.2)
ALK PHOS: 78 U/L (ref 39–117)
ALT: 26 U/L (ref 0–53)
AST: 30 U/L (ref 0–37)
BILIRUBIN TOTAL: 1.5 mg/dL — AB (ref 0.2–1.2)
BUN: 11 mg/dL (ref 6–23)
CO2: 30 mEq/L (ref 19–32)
Calcium: 9.6 mg/dL (ref 8.4–10.5)
Chloride: 100 mEq/L (ref 96–112)
Creatinine, Ser: 0.79 mg/dL (ref 0.40–1.50)
GFR: 108.26 mL/min (ref >60.00)
GLUCOSE: 174 mg/dL — AB (ref 70–99)
Potassium: 4.3 mEq/L (ref 3.5–5.1)
Sodium: 139 mEq/L (ref 135–145)
TOTAL PROTEIN: 7.3 g/dL (ref 6.0–8.3)

## 2017-12-19 LAB — CBC WITH DIFFERENTIAL/PLATELET
BASOS ABS: 0 10*3/uL (ref 0.0–0.1)
Basophils Relative: 0.4 % (ref 0.0–3.0)
EOS ABS: 0.1 10*3/uL (ref 0.0–0.7)
Eosinophils Relative: 2.5 % (ref 0.0–5.0)
HEMATOCRIT: 45 % (ref 39.0–52.0)
Hemoglobin: 15.3 g/dL (ref 13.0–17.0)
LYMPHS ABS: 1.4 10*3/uL (ref 0.7–4.0)
LYMPHS PCT: 28.9 % (ref 12.0–46.0)
MCHC: 34 g/dL (ref 30.0–36.0)
MCV: 93.3 fl (ref 78.0–100.0)
Monocytes Absolute: 0.6 10*3/uL (ref 0.1–1.0)
Monocytes Relative: 12.9 % — ABNORMAL HIGH (ref 3.0–12.0)
NEUTROS ABS: 2.7 10*3/uL (ref 1.4–7.7)
NEUTROS PCT: 55.3 % (ref 43.0–77.0)
PLATELETS: 99 10*3/uL — AB (ref 150.0–400.0)
RBC: 4.82 Mil/uL (ref 4.22–5.81)
RDW: 13.4 % (ref 11.5–15.5)
WBC: 4.9 10*3/uL (ref 4.0–10.5)

## 2017-12-19 LAB — HEPATITIS C ANTIBODY
HEP C AB: NONREACTIVE
SIGNAL TO CUT-OFF: 0.02 (ref <1.00)

## 2017-12-19 LAB — TSH: TSH: 1.76 u[IU]/mL (ref 0.35–4.50)

## 2017-12-19 LAB — MICROALBUMIN / CREATININE URINE RATIO
CREATININE, U: 210.3 mg/dL
MICROALB/CREAT RATIO: 1.1 mg/g (ref 0.0–30.0)
Microalb, Ur: 2.2 mg/dL — ABNORMAL HIGH (ref 0.0–1.9)

## 2017-12-19 LAB — PSA: PSA: 1.3 ng/mL (ref 0.10–4.00)

## 2017-12-20 ENCOUNTER — Other Ambulatory Visit: Payer: Self-pay | Admitting: Internal Medicine

## 2017-12-21 ENCOUNTER — Telehealth: Payer: Self-pay | Admitting: Internal Medicine

## 2017-12-21 NOTE — Telephone Encounter (Signed)
Left message notifying pt that refill was sent to requested pharmacy on 1/10 and to check with pharmacy to see when refill will be available.

## 2017-12-21 NOTE — Telephone Encounter (Signed)
Copied from [place] [id number]. Topic: Quick Communication - Rx Refill/Question >> Dec 21, 2017 12:30 PM Yvette Rack wrote: Medication: metFORMIN (GLUCOPHAGE) 1000 MG tablet  patient is out of this medicine   Has the patient contacted their pharmacy? Yes.     (Agent: If no, request that the patient contact the pharmacy for the refill.)   Preferred Pharmacy (with phone number or street name): [id number] [address], [place]. [phone] (Phone) [phone] (Fax)     Agent: Please be advised that RX refills may take up to 3 business days. We ask that you follow-up with your pharmacy.

## 2017-12-24 ENCOUNTER — Encounter: Payer: Self-pay | Admitting: Gastroenterology

## 2018-01-21 ENCOUNTER — Ambulatory Visit: Payer: Commercial Managed Care - PPO | Admitting: Internal Medicine

## 2018-01-21 ENCOUNTER — Encounter: Payer: Self-pay | Admitting: Internal Medicine

## 2018-01-21 VITALS — BP 120/78 | HR 82 | Temp 98.3°F | Ht 72.0 in | Wt 198.0 lb

## 2018-01-21 DIAGNOSIS — I10 Essential (primary) hypertension: Secondary | ICD-10-CM | POA: Diagnosis not present

## 2018-01-21 DIAGNOSIS — E119 Type 2 diabetes mellitus without complications: Secondary | ICD-10-CM | POA: Diagnosis not present

## 2018-01-21 DIAGNOSIS — E785 Hyperlipidemia, unspecified: Secondary | ICD-10-CM
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
## 2018-01-21 NOTE — Patient Instructions (Signed)
Limit your sodium (Salt) intake  Please check your blood pressure on a regular basis.  If it is consistently greater than 140/90, please make an office appointment.    It is important that you exercise regularly, at least 20 minutes 3 to 4 times per week.  If you develop chest pain or shortness of breath seek  medical attention.   Please check your hemoglobin A1c every 3 months

## 2018-01-28 ENCOUNTER — Other Ambulatory Visit: Payer: Self-pay

## 2018-01-28 ENCOUNTER — Ambulatory Visit (AMBULATORY_SURGERY_CENTER): Payer: Self-pay

## 2018-01-28 VITALS — Ht 72.0 in | Wt 198.0 lb

## 2018-01-28 DIAGNOSIS — Z1211 Encounter for screening for malignant neoplasm of colon: Secondary | ICD-10-CM

## 2018-01-28 MED ORDER — NA SULFATE-K SULFATE-MG SULF 17.5-3.13-1.6 GM/177ML PO SOLN: 1.0000 | Freq: Once | ORAL | 0 refills | Status: AC

## 2018-01-28 NOTE — Progress Notes (Signed)
Denies allergies to eggs or soy products. Denies complication of anesthesia or sedation. Denies use of weight loss medication. Denies use of O2.   Emmi instructions declined.  

## 2018-01-29 ENCOUNTER — Encounter: Payer: Self-pay | Admitting: Gastroenterology

## 2018-02-08 ENCOUNTER — Encounter: Payer: Self-pay | Admitting: Gastroenterology

## 2018-02-08 ENCOUNTER — Other Ambulatory Visit: Payer: Self-pay

## 2018-02-08 ENCOUNTER — Ambulatory Visit (AMBULATORY_SURGERY_CENTER): Payer: Commercial Managed Care - PPO | Admitting: Gastroenterology

## 2018-02-08 VITALS — BP 107/64 | HR 67 | Temp 98.2°F | Resp 12 | Ht 72.0 in | Wt 198.0 lb

## 2018-02-08 DIAGNOSIS — D123 Benign neoplasm of transverse colon: Secondary | ICD-10-CM | POA: Diagnosis not present

## 2018-02-08 DIAGNOSIS — E119 Type 2 diabetes mellitus without complications: Secondary | ICD-10-CM | POA: Diagnosis not present

## 2018-02-08 DIAGNOSIS — Z1211 Encounter for screening for malignant neoplasm of colon: Secondary | ICD-10-CM

## 2018-02-08 DIAGNOSIS — I10 Essential (primary) hypertension: Secondary | ICD-10-CM | POA: Diagnosis not present

## 2018-02-08 MED ORDER — SODIUM CHLORIDE 0.9 % IV SOLN: 500.0000 mL | Freq: Once | INTRAVENOUS | Status: DC

## 2018-02-08 NOTE — Op Note (Signed)
[facility] Patient Name: Dylan Blake Procedure Date: 02/08/2018 9:24 AM MRN: [id number] Endoscopist: Remo Lipps P. Haik Mahoney MD, MD Age: 57 Referring MD:  Date of Birth: 08-13-1961 Gender: Male Account #: [id number] Procedure:                Colonoscopy Indications:              Screening for colorectal malignant neoplasm, This                            is the patient's first colonoscopy Medicines:                Monitored Anesthesia Care Procedure:                Pre-Anesthesia Assessment:                           - Prior to the procedure, a History and Physical                            was performed, and patient medications and                            allergies were reviewed. The patient's tolerance of                            previous anesthesia was also reviewed. The risks                            and benefits of the procedure and the sedation                            options and risks were discussed with the patient.                            All questions were answered, and informed consent                            was obtained. Prior Anticoagulants: The patient has                            taken no previous anticoagulant or antiplatelet                            agents. ASA Grade Assessment: II - A patient with                            mild systemic disease. After reviewing the risks                            and benefits, the patient was deemed in                            satisfactory condition to undergo the procedure.  After obtaining informed consent, the colonoscope                            was passed under direct vision. Throughout the                            procedure, the patient's blood pressure, pulse, and                            oxygen saturations were monitored continuously. The                            Colonoscope was introduced through the anus and                            advanced to the the  cecum, identified by                            appendiceal orifice and ileocecal valve. The                            colonoscopy was performed without difficulty. The                            patient tolerated the procedure well. The quality                            of the bowel preparation was adequate. The                            ileocecal valve, appendiceal orifice, and rectum                            were photographed. Scope In: 9:37:35 AM Scope Out: 9:57:15 AM Scope Withdrawal Time: 0 hours 17 minutes 1 second  Total Procedure Duration: 0 hours 19 minutes 40 seconds  Findings:                 The perianal and digital rectal examinations were                            normal.                           A moderate amount of liquid stool was found in the                            entire colon during intubation. Lavage of the colon                            using copious amounts of sterile water resulted in                            clearance with good visualization.  A single small angiodysplastic lesion was found in                            the ascending colon.                           A 4 mm polyp was found in the transverse colon. The                            polyp was sessile. The polyp was removed with a                            cold snare. Resection and retrieval were complete.                           A few small-mouthed diverticula were found in the                            sigmoid colon.                           Internal hemorrhoids were found during                            retroflexion. The hemorrhoids were small.                           The exam was otherwise without abnormality. Complications:            No immediate complications. Estimated blood loss:                            Minimal. Estimated Blood Loss:     Estimated blood loss was minimal. Impression:               - A single colonic angiodysplastic lesion.                            - One 4 mm polyp in the transverse colon, removed                            with a cold snare. Resected and retrieved.                           - Diverticulosis in the sigmoid colon.                           - Internal hemorrhoids.                           - The examination was otherwise normal. Recommendation:           - Patient has a contact number available for                            emergencies. The signs and symptoms of potential  delayed complications were discussed with the                            patient. Return to normal activities tomorrow.                            Written discharge instructions were provided to the                            patient.                           - Resume previous diet.                           - Continue present medications.                           - Await pathology results.                           - Repeat colonoscopy is recommended for                            surveillance. The colonoscopy date will be                            determined after pathology results from today's                            exam become available for review. Remo Lipps P. Fernande Treiber MD, MD 02/08/2018 10:01:21 AM This report has been signed electronically.

## 2018-02-08 NOTE — Progress Notes (Signed)
Pt's states no medical or surgical changes since previsit or office visit. 

## 2018-02-08 NOTE — Progress Notes (Signed)
Called to room to assist during endoscopic procedure.  Patient ID and intended procedure confirmed with present staff. Received instructions for my participation in the procedure from the performing physician.  

## 2018-02-08 NOTE — Progress Notes (Signed)
To recovery, report to RN, VSS. 

## 2018-02-08 NOTE — Patient Instructions (Signed)
YOU HAD AN ENDOSCOPIC PROCEDURE TODAY AT THE [facility]:   Refer to the procedure report that was given to you for any specific questions about what was found during the examination.  If the procedure report does not answer your questions, please call your gastroenterologist to clarify.  If you requested that your care partner not be given the details of your procedure findings, then the procedure report has been included in a sealed envelope for you to review at your convenience later.  YOU SHOULD EXPECT: Some feelings of bloating in the abdomen. Passage of more gas than usual.  Walking can help get rid of the air that was put into your GI tract during the procedure and reduce the bloating. If you had a lower endoscopy (such as a colonoscopy or flexible sigmoidoscopy) you may notice spotting of blood in your stool or on the toilet paper. If you underwent a bowel prep for your procedure, you may not have a normal bowel movement for a few days.  Please Note:  You might notice some irritation and congestion in your nose or some drainage.  This is from the oxygen used during your procedure.  There is no need for concern and it should clear up in a day or so.  SYMPTOMS TO REPORT IMMEDIATELY:   Following lower endoscopy (colonoscopy or flexible sigmoidoscopy):  Excessive amounts of blood in the stool  Significant tenderness or worsening of abdominal pains  Swelling of the abdomen that is new, acute  Fever of 100F or higher  Please see handouts given to you on Polyps, Diverticulosis and Hemorrhoids.  For urgent or emergent issues, a gastroenterologist can be reached at any hour by calling [phone].   DIET:  We do recommend a small meal at first, but then you may proceed to your regular diet.  Drink plenty of fluids but you should avoid alcoholic beverages for 24 hours.  ACTIVITY:  You should plan to take it easy for the rest of today and you should NOT DRIVE or use heavy  machinery until tomorrow (because of the sedation medicines used during the test).    FOLLOW UP: Our staff will call the number listed on your records the next business day following your procedure to check on you and address any questions or concerns that you may have regarding the information given to you following your procedure. If we do not reach you, we will leave a message.  However, if you are feeling well and you are not experiencing any problems, there is no need to return our call.  We will assume that you have returned to your regular daily activities without incident.  If any biopsies were taken you will be contacted by phone or by letter within the next 1-3 weeks.  Please call us at [phone] if you have not heard about the biopsies in 3 weeks.    SIGNATURES/CONFIDENTIALITY: You and/or your care partner have signed paperwork which will be entered into your electronic medical record.  These signatures attest to the fact that that the information above on your After Visit Summary has been reviewed and is understood.  Full responsibility of the confidentiality of this discharge information lies with you and/or your care-partner.  Thank you for letting us take care of your healthcare needs today. 

## 2018-02-11 ENCOUNTER — Telehealth: Payer: Self-pay | Admitting: *Deleted

## 2018-02-11 ENCOUNTER — Telehealth: Payer: Self-pay

## 2018-02-11 NOTE — Telephone Encounter (Signed)
  Follow up Call-  Call back number 02/08/2018  Post procedure Call Back phone  # [phone] cell  Permission to leave phone message Yes  Some recent data might be hidden     Patient questions:  Do you have a fever, pain , or abdominal swelling? No. Pain Score  0 *  Have you tolerated food without any problems? Yes.    Have you been able to return to your normal activities? Yes.    Do you have any questions about your discharge instructions: Diet   No. Medications  No. Follow up visit  No.  Do you have questions or concerns about your Care? No.  Actions: * If pain score is 4 or above: No action needed, pain <4.

## 2018-02-11 NOTE — Telephone Encounter (Signed)
Left message on answering machine. 

## 2018-02-13 ENCOUNTER — Encounter: Payer: Self-pay | Admitting: Gastroenterology

## 2018-03-05 ENCOUNTER — Telehealth: Payer: Self-pay | Admitting: Internal Medicine

## 2018-03-05 NOTE — Telephone Encounter (Signed)
Copied from [place] [id number]. Topic: Quick Communication - Rx Refill/Question >> Mar 05, 2018  3:45 PM Scherrie Gerlach wrote: Medication: sertraline (ZOLOFT) 50 MG tablet  Pt states Dr Raliegh Ip increased this Rx to 50 mg. Pt states he is supposed to tale 1 tab and not 1/2 as the current instructions state. Pt states Dr Raliegh Ip verified with him at last visit ok for a 50 mg increase  Pharmacy will not fill because this is too early.  [id number] [address], [place] [phone] (Phone) [phone] (Fax)

## 2018-03-06 NOTE — Telephone Encounter (Signed)
Called patient and left message to return call

## 2018-03-10 NOTE — Telephone Encounter (Signed)
Okay to refill sertraline 50 mg 1 tablet daily #90

## 2018-03-11 ENCOUNTER — Telehealth: Payer: Self-pay

## 2018-03-11 MED ORDER — SERTRALINE HCL 50 MG PO TABS: 50.0000 mg | ORAL_TABLET | Freq: Every day | ORAL | 3 refills | Status: DC

## 2018-03-11 NOTE — Telephone Encounter (Signed)
Medication sent in electronically.  

## 2018-03-11 NOTE — Telephone Encounter (Signed)
Called patient and left message informing patient that new prescription was sent in electronically.

## 2018-03-11 NOTE — Addendum Note (Signed)
Addended by: Gwenyth Ober R on: 03/11/2018 11:47 AM   Modules accepted: Orders

## 2018-04-24 ENCOUNTER — Encounter: Payer: Self-pay | Admitting: Internal Medicine

## 2018-04-24 ENCOUNTER — Ambulatory Visit: Payer: Commercial Managed Care - PPO | Admitting: Internal Medicine

## 2018-04-24 VITALS — BP 102/60 | HR 62 | Temp 98.3°F | Wt 201.0 lb

## 2018-04-24 DIAGNOSIS — I10 Essential (primary) hypertension: Secondary | ICD-10-CM | POA: Diagnosis not present

## 2018-04-24 DIAGNOSIS — E119 Type 2 diabetes mellitus without complications: Secondary | ICD-10-CM

## 2018-04-24 LAB — POCT GLYCOSYLATED HEMOGLOBIN (HGB A1C): Hemoglobin A1C: 6.7

## 2018-04-24 NOTE — Patient Instructions (Signed)
Discontinue glipizide   Please check your hemoglobin A1c every 3-6  Months
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
## 2018-08-26 ENCOUNTER — Ambulatory Visit: Payer: Commercial Managed Care - PPO | Admitting: Internal Medicine

## 2018-08-26 ENCOUNTER — Encounter: Payer: Self-pay | Admitting: Internal Medicine

## 2018-08-26 VITALS — BP 112/68 | HR 65 | Temp 98.1°F | Wt 206.4 lb

## 2018-08-26 DIAGNOSIS — I10 Essential (primary) hypertension: Secondary | ICD-10-CM

## 2018-08-26 DIAGNOSIS — E119 Type 2 diabetes mellitus without complications: Secondary | ICD-10-CM | POA: Diagnosis not present

## 2018-08-26 LAB — POCT GLYCOSYLATED HEMOGLOBIN (HGB A1C): Hemoglobin A1C: 9.9 % — AB (ref 4.0–5.6)

## 2018-08-26 MED ORDER — METFORMIN HCL 1000 MG PO TABS: 1000.0000 mg | ORAL_TABLET | Freq: Two times a day (BID) | ORAL | 3 refills | Status: DC

## 2018-08-26 MED ORDER — OLMESARTAN MEDOXOMIL 20 MG PO TABS: 20.0000 mg | ORAL_TABLET | Freq: Every day | ORAL | 4 refills | Status: DC

## 2018-08-26 MED ORDER — DULAGLUTIDE 1.5 MG/0.5ML ~~LOC~~ SOAJ: 1.5000 mg | SUBCUTANEOUS | 6 refills | Status: DC

## 2018-08-26 MED ORDER — DAPAGLIFLOZIN PROPANEDIOL 10 MG PO TABS: 10.0000 mg | ORAL_TABLET | Freq: Every day | ORAL | 6 refills | Status: DC

## 2018-08-26 MED ORDER — ROSUVASTATIN CALCIUM 20 MG PO TABS: 20.0000 mg | ORAL_TABLET | Freq: Every day | ORAL | 3 refills | Status: DC
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
## 2018-08-26 NOTE — Patient Instructions (Addendum)
Limit your sodium (Salt) intake   Please check your hemoglobin A1c every 3 months    It is important that you exercise regularly, at least 20 minutes 3 to 4 times per week.  If you develop chest pain or shortness of breath seek  medical attention.  You need to lose weight.  Consider a lower calorie diet and regular exercise.   Diabetes Mellitus and Nutrition When you have diabetes (diabetes mellitus), it is very important to have healthy eating habits because your blood sugar (glucose) levels are greatly affected by what you eat and drink. Eating healthy foods in the appropriate amounts, at about the same times every day, can help you:  Control your blood glucose.  Lower your risk of heart disease.  Improve your blood pressure.  Reach or maintain a healthy weight.  Every person with diabetes is different, and each person has different needs for a meal plan. Your health care provider may recommend that you work with a diet and nutrition specialist (dietitian) to make a meal plan that is best for you. Your meal plan may vary depending on factors such as:  The calories you need.  The medicines you take.  Your weight.  Your blood glucose, blood pressure, and cholesterol levels.  Your activity level.  Other health conditions you have, such as heart or kidney disease.  How do carbohydrates affect me? Carbohydrates affect your blood glucose level more than any other type of food. Eating carbohydrates naturally increases the amount of glucose in your blood. Carbohydrate counting is a method for keeping track of how many carbohydrates you eat. Counting carbohydrates is important to keep your blood glucose at a healthy level, especially if you use insulin or take certain oral diabetes medicines. It is important to know how many carbohydrates you can safely have in each meal. This is different for every person. Your dietitian can help you calculate how many carbohydrates you should have at  each meal and for snack. Foods that contain carbohydrates include:  Bread, cereal, rice, pasta, and crackers.  Potatoes and corn.  Peas, beans, and lentils.  Milk and yogurt.  Fruit and juice.  Desserts, such as cakes, cookies, ice cream, and candy.  How does alcohol affect me? Alcohol can cause a sudden decrease in blood glucose (hypoglycemia), especially if you use insulin or take certain oral diabetes medicines. Hypoglycemia can be a life-threatening condition. Symptoms of hypoglycemia (sleepiness, dizziness, and confusion) are similar to symptoms of having too much alcohol. If your health care provider says that alcohol is safe for you, follow these guidelines:  Limit alcohol intake to no more than 1 drink per day for nonpregnant women and 2 drinks per day for men. One drink equals 12 oz of beer, 5 oz of wine, or 1 oz of hard liquor.  Do not drink on an empty stomach.  Keep yourself hydrated with water, diet soda, or unsweetened iced tea.  Keep in mind that regular soda, juice, and other mixers may contain a lot of sugar and must be counted as carbohydrates.  What are tips for following this plan? Reading food labels  Start by checking the serving size on the label. The amount of calories, carbohydrates, fats, and other nutrients listed on the label are based on one serving of the food. Many foods contain more than one serving per package.  Check the total grams (g) of carbohydrates in one serving. You can calculate the number of servings of carbohydrates in one serving by  dividing the total carbohydrates by 15. For example, if a food has 30 g of total carbohydrates, it would be equal to 2 servings of carbohydrates.  Check the number of grams (g) of saturated and trans fats in one serving. Choose foods that have low or no amount of these fats.  Check the number of milligrams (mg) of sodium in one serving. Most people should limit total sodium intake to less than 2,300 mg per  day.  Always check the nutrition information of foods labeled as "low-fat" or "nonfat". These foods may be higher in added sugar or refined carbohydrates and should be avoided.  Talk to your dietitian to identify your daily goals for nutrients listed on the label. Shopping  Avoid buying canned, premade, or processed foods. These foods tend to be high in fat, sodium, and added sugar.  Shop around the outside edge of the grocery store. This includes fresh fruits and vegetables, bulk grains, fresh meats, and fresh dairy. Cooking  Use low-heat cooking methods, such as baking, instead of high-heat cooking methods like deep frying.  Cook using healthy oils, such as olive, canola, or sunflower oil.  Avoid cooking with butter, cream, or high-fat meats. Meal planning  Eat meals and snacks regularly, preferably at the same times every day. Avoid going long periods of time without eating.  Eat foods high in fiber, such as fresh fruits, vegetables, beans, and whole grains. Talk to your dietitian about how many servings of carbohydrates you can eat at each meal.  Eat 4-6 ounces of lean protein each day, such as lean meat, chicken, fish, eggs, or tofu. 1 ounce is equal to 1 ounce of meat, chicken, or fish, 1 egg, or 1/4 cup of tofu.  Eat some foods each day that contain healthy fats, such as avocado, nuts, seeds, and fish. Lifestyle   Check your blood glucose regularly.  Exercise at least 30 minutes 5 or more days each week, or as told by your health care provider.  Take medicines as told by your health care provider.  Do not use any products that contain nicotine or tobacco, such as cigarettes and e-cigarettes. If you need help quitting, ask your health care provider.  Work with a Social worker or diabetes educator to identify strategies to manage stress and any emotional and social challenges. What are some questions to ask my health care provider?  Do I need to meet with a diabetes  educator?  Do I need to meet with a dietitian?  What number can I call if I have questions?  When are the best times to check my blood glucose? Where to find more information:  American Diabetes Association: diabetes.org/food-and-fitness/food  Academy of Nutrition and Dietetics: PokerClues.dk  [employer] of Diabetes and Digestive and Kidney Diseases (NIH): ContactWire.be Summary  A healthy meal plan will help you control your blood glucose and maintain a healthy lifestyle.  Working with a diet and nutrition specialist (dietitian) can help you make a meal plan that is best for you.  Keep in mind that carbohydrates and alcohol have immediate effects on your blood glucose levels. It is important to count carbohydrates and to use alcohol carefully. This information is not intended to replace advice given to you by your health care provider. Make sure you discuss any questions you have with your health care provider. Document Released: 08/24/2005 Document Revised: 01/01/2017 Document Reviewed: 01/01/2017 Elsevier Interactive Patient Education  2018 [place] for Eating Away From Home If You Have Diabetes  Controlling your level of blood glucose, also known as blood sugar, can be challenging. It can be even more difficult when you do not prepare your own meals. The following tips can help you manage your diabetes when you eat away from home. Planning ahead Plan ahead if you know you will be eating away from home:  Ask your health care provider how to time meals and medicine if you are taking insulin.  Make a list of restaurants near you that offer healthy choices. If they have a carry-out menu, take it home and plan what you will order ahead of time.  Look up the restaurant you want to eat at online. Many chain and fast-food restaurants list  nutritional information online. Use this information to choose the healthiest options and to calculate how many carbohydrates will be in your meal.  Use a carbohydrate-counting book or mobile app to look up the carbohydrate content and serving size of the foods you want to eat.  Become familiar with serving sizes and learn to recognize how many servings are in a portion. This will allow you to estimate how many carbohydrates you can eat.  Free foods A "free food" is any food or drink that has less than 5 g of carbohydrates per serving. Free foods include:  Many vegetables.  Hard boiled eggs.  Nuts or seeds.  Olives.  Cheeses.  Meats.  These types of foods make good appetizer choices and are often available at salad bars. Lemon juice, vinegar, or a low-calorie salad dressing of fewer than 20 calories per serving can be used as a "free" salad dressing. Choices to reduce carbohydrates  Substitute nonfat sweetened yogurt with a sugar-free yogurt. Yogurt made from soy milk may also be used, but you will still want a sugar-free or plain option to choose a lower carbohydrate amount.  Ask your server to take away the bread basket or chips from your table.  Order fresh fruit. A salad bar often offers fresh fruit choices. Avoid canned fruit because it is usually packed in sugar or syrup.  Order a salad, and eat it without dressing. Or, create a "free" salad dressing.  Ask for substitutions. For example, instead of [place] fries, request an order of a vegetable such as salad, green beans, or broccoli. Other tips  If you take insulin, take the insulin once your food arrives to your table. This will ensure your insulin and food are timed correctly.  Ask your server about the portion size before your order, and ask for a take-out box if the portion has more servings than you should have. When your food comes, leave the amount you should have on the plate, and put the rest in the take-out  box.  Consider splitting an entree with someone and ordering a side salad. This information is not intended to replace advice given to you by your health care provider. Make sure you discuss any questions you have with your health care provider. Document Released: 11/27/2005 Document Revised: 05/04/2016 Document Reviewed: 02/24/2014 Elsevier Interactive Patient Education  [employer].

## 2018-11-26 ENCOUNTER — Encounter: Payer: Commercial Managed Care - PPO | Admitting: Internal Medicine

## 2018-12-24 ENCOUNTER — Ambulatory Visit: Payer: Commercial Managed Care - PPO | Admitting: Internal Medicine

## 2018-12-24 ENCOUNTER — Encounter: Payer: Self-pay | Admitting: Internal Medicine

## 2018-12-24 VITALS — BP 140/90 | HR 90 | Temp 98.1°F | Ht 72.0 in | Wt 199.0 lb

## 2018-12-24 DIAGNOSIS — I10 Essential (primary) hypertension: Secondary | ICD-10-CM | POA: Diagnosis not present

## 2018-12-24 DIAGNOSIS — E785 Hyperlipidemia, unspecified: Secondary | ICD-10-CM | POA: Diagnosis not present

## 2018-12-24 DIAGNOSIS — E119 Type 2 diabetes mellitus without complications: Secondary | ICD-10-CM

## 2018-12-24 DIAGNOSIS — F320 Major depressive disorder, single episode, mild: Secondary | ICD-10-CM | POA: Diagnosis not present

## 2018-12-24 LAB — POCT GLYCOSYLATED HEMOGLOBIN (HGB A1C): Hemoglobin A1C: 9.2 % — AB (ref 4.0–5.6)

## 2018-12-24 MED ORDER — DULAGLUTIDE 1.5 MG/0.5ML ~~LOC~~ SOAJ: 1.5000 mg | SUBCUTANEOUS | 6 refills | Status: DC

## 2018-12-24 MED ORDER — ASPIRIN EC 81 MG PO TBEC: 81.0000 mg | DELAYED_RELEASE_TABLET | Freq: Every day | ORAL | 11 refills | Status: DC
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
## 2018-12-24 NOTE — Patient Instructions (Addendum)
-It was nice meeting you today!  -Increase metformin to 1000 mg twice a day.  -Start taking aspirin 81 mg daily.  -Continue to work on lifestyle modifications: better eating and increased physical activity. We may need to discuss insulin at next visit if no significant improvement.  -Schedule follow up in 3 months. Please come in fasting to that appointment.   Mediterranean Diet A Mediterranean diet refers to food and lifestyle choices that are based on the traditions of countries located on the [employer]. This way of eating has been shown to help prevent certain conditions and improve outcomes for people who have chronic diseases, like kidney disease and heart disease. What are tips for following this plan? Lifestyle  Cook and eat meals together with your family, when possible.  Drink enough fluid to keep your urine clear or pale yellow.  Be physically active every day. This includes: ? Aerobic exercise like running or swimming. ? Leisure activities like gardening, walking, or housework.  Get 7-8 hours of sleep each night.  If recommended by your health care provider, drink red wine in moderation. This means 1 glass a day for nonpregnant women and 2 glasses a day for men. A glass of wine equals 5 oz (150 mL). Reading food labels   Check the serving size of packaged foods. For foods such as rice and pasta, the serving size refers to the amount of cooked product, not dry.  Check the total fat in packaged foods. Avoid foods that have saturated fat or trans fats.  Check the ingredients list for added sugars, such as corn syrup. Shopping  At the grocery store, buy most of your food from the areas near the walls of the store. This includes: ? Fresh fruits and vegetables (produce). ? Grains, beans, nuts, and seeds. Some of these may be available in unpackaged forms or large amounts (in bulk). ? Fresh seafood. ? Poultry and eggs. ? Low-fat dairy products.  Buy whole  ingredients instead of prepackaged foods.  Buy fresh fruits and vegetables in-season from local farmers markets.  Buy frozen fruits and vegetables in resealable bags.  If you do not have access to quality fresh seafood, buy precooked frozen shrimp or canned fish, such as tuna, salmon, or sardines.  Buy small amounts of raw or cooked vegetables, salads, or olives from the deli or salad bar at your store.  Stock your pantry so you always have certain foods on hand, such as olive oil, canned tuna, canned tomatoes, rice, pasta, and beans. Cooking  Cook foods with extra-virgin olive oil instead of using butter or other vegetable oils.  Have meat as a side dish, and have vegetables or grains as your main dish. This means having meat in small portions or adding small amounts of meat to foods like pasta or stew.  Use beans or vegetables instead of meat in common dishes like chili or lasagna.  Experiment with different cooking methods. Try roasting or broiling vegetables instead of steaming or sauteing them.  Add frozen vegetables to soups, stews, pasta, or rice.  Add nuts or seeds for added healthy fat at each meal. You can add these to yogurt, salads, or vegetable dishes.  Marinate fish or vegetables using olive oil, lemon juice, garlic, and fresh herbs. Meal planning   Plan to eat 1 vegetarian meal one day each week. Try to work up to 2 vegetarian meals, if possible.  Eat seafood 2 or more times a week.  Have healthy snacks readily available,  such as: ? Vegetable sticks with hummus. ? [place] yogurt. ? Fruit and nut trail mix.  Eat balanced meals throughout the week. This includes: ? Fruit: 2-3 servings a day ? Vegetables: 4-5 servings a day ? Low-fat dairy: 2 servings a day ? Fish, poultry, or lean meat: 1 serving a day ? Beans and legumes: 2 or more servings a week ? Nuts and seeds: 1-2 servings a day ? Whole grains: 6-8 servings a day ? Extra-virgin olive oil: 3-4 servings a  day  Limit red meat and sweets to only a few servings a month What are my food choices?  Mediterranean diet ? Recommended ? Grains: Whole-grain pasta. Brown rice. Bulgar wheat. Polenta. Couscous. Whole-wheat bread. [place]. ? Vegetables: Artichokes. Beets. Broccoli. Cabbage. Carrots. Eggplant. Green beans. Chard. Kale. Spinach. Onions. Leeks. Peas. Squash. Tomatoes. Peppers. Radishes. ? Fruits: Apples. Apricots. Avocado. Berries. Bananas. Cherries. Dates. Figs. Grapes. Lemons. Melon. Oranges. Peaches. Plums. Pomegranate. ? Meats and other protein foods: Beans. Almonds. Sunflower seeds. Pine nuts. Peanuts. [place]. Salmon. Scallops. Shrimp. [place]. Tilapia. Clams. Oysters. Eggs. ? Dairy: Low-fat milk. Cheese. Greek yogurt. ? Beverages: Water. Red wine. Herbal tea. ? Fats and oils: Extra virgin olive oil. Avocado oil. Grape seed oil. ? Sweets and desserts: [place] yogurt with honey. Baked apples. Poached pears. Trail mix. ? Seasoning and other foods: Basil. Cilantro. Coriander. Cumin. Mint. Parsley. Sage. Rosemary. Tarragon. Garlic. Oregano. Thyme. Pepper. Balsalmic vinegar. Tahini. Hummus. Tomato sauce. Olives. Mushrooms. ? Limit these ? Grains: Prepackaged pasta or rice dishes. Prepackaged cereal with added sugar. ? Vegetables: Deep fried potatoes (french fries). ? Fruits: Fruit canned in syrup. ? Meats and other protein foods: Beef. Pork. Lamb. Poultry with skin. Hot dogs. [place] Salines. ? Dairy: Ice cream. Sour cream. Whole milk. ? Beverages: Juice. Sugar-sweetened soft drinks. Beer. Liquor and spirits. ? Fats and oils: Butter. Canola oil. Vegetable oil. Beef fat (tallow). Lard. ? Sweets and desserts: Cookies. Cakes. Pies. Candy. ? Seasoning and other foods: Mayonnaise. Premade sauces and marinades. ? The items listed may not be a complete list. Talk with your dietitian about what dietary choices are right for you. Summary  The Mediterranean diet includes both food and lifestyle  choices.  Eat a variety of fresh fruits and vegetables, beans, nuts, seeds, and whole grains.  Limit the amount of red meat and sweets that you eat.  Talk with your health care provider about whether it is safe for you to drink red wine in moderation. This means 1 glass a day for nonpregnant women and 2 glasses a day for men. A glass of wine equals 5 oz (150 mL). This information is not intended to replace advice given to you by your health care provider. Make sure you discuss any questions you have with your health care provider. Document Released: 07/20/2016 Document Revised: 08/22/2016 Document Reviewed: 07/20/2016 Elsevier Interactive Patient Education  2019 [employer].

## 2019-03-24 ENCOUNTER — Telehealth: Payer: Self-pay | Admitting: *Deleted

## 2019-03-24 NOTE — Telephone Encounter (Signed)
Left message on machine that the appointment was cancelled

## 2019-03-24 NOTE — Telephone Encounter (Signed)
Copied from [place] [id number]. Topic: Quick Communication - Appointment Cancellation >> Mar 21, 2019  8:40 AM Keene Breath wrote: Patient called to cancel appointment scheduled for 04/14.  Patient HAS NOT rescheduled their appointment.  Route to department's PEC pool.

## 2019-03-25 ENCOUNTER — Ambulatory Visit: Payer: Commercial Managed Care - PPO | Admitting: Internal Medicine

## 2019-07-06 ENCOUNTER — Other Ambulatory Visit: Payer: Self-pay

## 2019-07-06 ENCOUNTER — Observation Stay (HOSPITAL_COMMUNITY)
Admission: EM | Admit: 2019-07-06 | Discharge: 2019-07-07 | Disposition: A | Discharge status: Home / Self Care | Payer: Commercial Managed Care - PPO | Attending: Family Medicine | Admitting: Family Medicine

## 2019-07-06 ENCOUNTER — Emergency Department (HOSPITAL_COMMUNITY): Payer: Commercial Managed Care - PPO

## 2019-07-06 ENCOUNTER — Encounter (HOSPITAL_COMMUNITY): Payer: Self-pay | Admitting: Emergency Medicine

## 2019-07-06 DIAGNOSIS — Z20828 Contact with and (suspected) exposure to other viral communicable diseases: Secondary | ICD-10-CM | POA: Diagnosis not present

## 2019-07-06 DIAGNOSIS — R079 Chest pain, unspecified: Secondary | ICD-10-CM | POA: Diagnosis present

## 2019-07-06 DIAGNOSIS — E785 Hyperlipidemia, unspecified: Secondary | ICD-10-CM | POA: Insufficient documentation

## 2019-07-06 DIAGNOSIS — Z8249 Family history of ischemic heart disease and other diseases of the circulatory system: Secondary | ICD-10-CM

## 2019-07-06 DIAGNOSIS — R0789 Other chest pain: Principal | ICD-10-CM | POA: Insufficient documentation

## 2019-07-06 DIAGNOSIS — R739 Hyperglycemia, unspecified: Secondary | ICD-10-CM

## 2019-07-06 DIAGNOSIS — I10 Essential (primary) hypertension: Secondary | ICD-10-CM | POA: Diagnosis not present

## 2019-07-06 DIAGNOSIS — R072 Precordial pain: Secondary | ICD-10-CM

## 2019-07-06 DIAGNOSIS — E782 Mixed hyperlipidemia: Secondary | ICD-10-CM

## 2019-07-06 DIAGNOSIS — E1165 Type 2 diabetes mellitus with hyperglycemia: Secondary | ICD-10-CM | POA: Diagnosis not present

## 2019-07-06 DIAGNOSIS — Z7982 Long term (current) use of aspirin: Secondary | ICD-10-CM | POA: Diagnosis not present

## 2019-07-06 DIAGNOSIS — E119 Type 2 diabetes mellitus without complications: Secondary | ICD-10-CM | POA: Diagnosis not present

## 2019-07-06 DIAGNOSIS — Z7984 Long term (current) use of oral hypoglycemic drugs: Secondary | ICD-10-CM | POA: Diagnosis not present

## 2019-07-06 DIAGNOSIS — Z79899 Other long term (current) drug therapy: Secondary | ICD-10-CM | POA: Insufficient documentation

## 2019-07-06 DIAGNOSIS — I700 Atherosclerosis of aorta: Secondary | ICD-10-CM | POA: Diagnosis not present

## 2019-07-06 HISTORY — DX: Other seasonal allergic rhinitis: J30.2

## 2019-07-06 HISTORY — DX: Essential (primary) hypertension: I10

## 2019-07-06 HISTORY — DX: Type 2 diabetes mellitus without complications: E11.9

## 2019-07-06 LAB — CBC
HCT: 44.4 % (ref 39.0–52.0)
Hemoglobin: 15.3 g/dL (ref 13.0–17.0)
MCH: 31.8 pg (ref 26.0–34.0)
MCHC: 34.5 g/dL (ref 30.0–36.0)
MCV: 92.3 fL (ref 80.0–100.0)
Platelets: 84 10*3/uL — ABNORMAL LOW (ref 150–400)
RBC: 4.81 MIL/uL (ref 4.22–5.81)
RDW: 12.6 % (ref 11.5–15.5)
WBC: 3.7 10*3/uL — ABNORMAL LOW (ref 4.0–10.5)
nRBC: 0 % (ref 0.0–0.2)

## 2019-07-06 LAB — BASIC METABOLIC PANEL
Anion gap: 8 (ref 5–15)
BUN: 8 mg/dL (ref 6–20)
CO2: 23 mmol/L (ref 22–32)
Calcium: 9.1 mg/dL (ref 8.9–10.3)
Chloride: 107 mmol/L (ref 98–111)
Creatinine, Ser: 0.84 mg/dL (ref 0.61–1.24)
GFR calc Af Amer: >60 mL/min (ref >60)
GFR calc non Af Amer: >60 mL/min (ref >60)
Glucose, Bld: 183 mg/dL — ABNORMAL HIGH (ref 70–99)
Potassium: 4.9 mmol/L (ref 3.5–5.1)
Sodium: 138 mmol/L (ref 135–145)

## 2019-07-06 LAB — LIPID PANEL
Cholesterol: 117 mg/dL (ref 0–200)
HDL: 55 mg/dL (ref >40)
LDL Cholesterol: 54 mg/dL (ref 0–99)
Total CHOL/HDL Ratio: 2.1 RATIO
Triglycerides: 38 mg/dL (ref <150)
VLDL: 8 mg/dL (ref 0–40)

## 2019-07-06 LAB — SARS CORONAVIRUS 2 BY RT PCR (HOSPITAL ORDER, PERFORMED IN ~~LOC~~ HOSPITAL LAB): SARS Coronavirus 2: NEGATIVE

## 2019-07-06 LAB — GLUCOSE, CAPILLARY
Glucose-Capillary: 95 mg/dL (ref 70–99)
Glucose-Capillary: 145 mg/dL — ABNORMAL HIGH (ref 70–99)
Glucose-Capillary: 148 mg/dL — ABNORMAL HIGH (ref 70–99)

## 2019-07-06 LAB — HEMOGLOBIN A1C
Hgb A1c MFr Bld: 7.7 % — ABNORMAL HIGH (ref 4.8–5.6)
Mean Plasma Glucose: 174.29 mg/dL

## 2019-07-06 LAB — TROPONIN I (HIGH SENSITIVITY)
Troponin I (High Sensitivity): <2 ng/L (ref <18)
Troponin I (High Sensitivity): 4 ng/L (ref <18)

## 2019-07-06 LAB — TSH: TSH: 0.751 u[IU]/mL (ref 0.350–4.500)

## 2019-07-06 MED ORDER — SODIUM CHLORIDE 0.9% FLUSH: 3.0000 mL | Freq: Once | INTRAVENOUS | Status: DC

## 2019-07-06 MED ORDER — INSULIN ASPART 100 UNIT/ML ~~LOC~~ SOLN
0.0000 [IU] | Freq: Three times a day (TID) | SUBCUTANEOUS | Status: DC
  Administered 2019-07-06: 2 [IU] via SUBCUTANEOUS

## 2019-07-06 MED ORDER — ROSUVASTATIN CALCIUM 20 MG PO TABS
20.0000 mg | ORAL_TABLET | Freq: Every day | ORAL | Status: DC
  Administered 2019-07-06 – 2019-07-07 (×2): 20 mg via ORAL
  Filled 2019-07-06 (×2): qty 1

## 2019-07-06 MED ORDER — IRBESARTAN 150 MG PO TABS
150.0000 mg | ORAL_TABLET | Freq: Every day | ORAL | Status: DC
  Administered 2019-07-06 – 2019-07-07 (×2): 150 mg via ORAL
  Filled 2019-07-06 (×2): qty 1

## 2019-07-06 MED ORDER — INSULIN ASPART 100 UNIT/ML ~~LOC~~ SOLN: 0.0000 [IU] | Freq: Every day | SUBCUTANEOUS | Status: DC

## 2019-07-06 MED ORDER — ENOXAPARIN SODIUM 40 MG/0.4ML ~~LOC~~ SOLN
40.0000 mg | SUBCUTANEOUS | Status: DC
  Administered 2019-07-06 – 2019-07-07 (×2): 40 mg via SUBCUTANEOUS
  Filled 2019-07-06 (×2): qty 0.4

## 2019-07-06 MED ORDER — ACETAMINOPHEN 325 MG PO TABS: 650.0000 mg | ORAL_TABLET | ORAL | Status: DC | PRN

## 2019-07-06 MED ORDER — ONDANSETRON HCL 4 MG/2ML IJ SOLN: 4.0000 mg | Freq: Four times a day (QID) | INTRAMUSCULAR | Status: DC | PRN

## 2019-07-06 MED ORDER — METOPROLOL TARTRATE 25 MG PO TABS
50.0000 mg | ORAL_TABLET | ORAL | Status: AC
  Administered 2019-07-07: 50 mg via ORAL
  Filled 2019-07-06: qty 2

## 2019-07-06 MED ORDER — ASPIRIN EC 81 MG PO TBEC
81.0000 mg | DELAYED_RELEASE_TABLET | Freq: Every day | ORAL | Status: DC
  Administered 2019-07-07: 81 mg via ORAL
  Filled 2019-07-06: qty 1
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
## 2019-07-06 NOTE — ED Triage Notes (Signed)
Pt woke up w/ substernal chest pain.  Despite taking antiacid, pain continues.  Pt only complains of dizziness, gone now.

## 2019-07-06 NOTE — ED Notes (Signed)
Wife Dylan Blake called, she is waiting in the car # is in pt contacts if needed
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
## 2019-07-07 ENCOUNTER — Observation Stay (HOSPITAL_COMMUNITY): Payer: Commercial Managed Care - PPO

## 2019-07-07 DIAGNOSIS — E785 Hyperlipidemia, unspecified: Secondary | ICD-10-CM | POA: Diagnosis not present

## 2019-07-07 DIAGNOSIS — R079 Chest pain, unspecified: Secondary | ICD-10-CM

## 2019-07-07 DIAGNOSIS — E119 Type 2 diabetes mellitus without complications: Secondary | ICD-10-CM | POA: Diagnosis not present

## 2019-07-07 DIAGNOSIS — R072 Precordial pain: Secondary | ICD-10-CM

## 2019-07-07 DIAGNOSIS — I10 Essential (primary) hypertension: Secondary | ICD-10-CM | POA: Diagnosis not present

## 2019-07-07 DIAGNOSIS — R0789 Other chest pain: Secondary | ICD-10-CM | POA: Diagnosis not present

## 2019-07-07 LAB — RAPID URINE DRUG SCREEN, HOSP PERFORMED
Amphetamines: NOT DETECTED
Barbiturates: NOT DETECTED
Benzodiazepines: NOT DETECTED
Cocaine: NOT DETECTED
Opiates: NOT DETECTED
Tetrahydrocannabinol: NOT DETECTED

## 2019-07-07 LAB — HIV ANTIBODY (ROUTINE TESTING W REFLEX): HIV Screen 4th Generation wRfx: NONREACTIVE

## 2019-07-07 LAB — GLUCOSE, CAPILLARY
Glucose-Capillary: 105 mg/dL — ABNORMAL HIGH (ref 70–99)
Glucose-Capillary: 132 mg/dL — ABNORMAL HIGH (ref 70–99)
Glucose-Capillary: 128 mg/dL — ABNORMAL HIGH (ref 70–99)

## 2019-07-07 MED ORDER — NITROGLYCERIN 0.4 MG SL SUBL
SUBLINGUAL_TABLET | SUBLINGUAL | Status: AC
  Administered 2019-07-07: 0.8 mg
  Filled 2019-07-07: qty 2

## 2019-07-07 MED ORDER — IOHEXOL 350 MG/ML SOLN
80.0000 mL | Freq: Once | INTRAVENOUS | Status: AC | PRN
  Administered 2019-07-07: 80 mL via INTRAVENOUS

## 2019-07-07 NOTE — Discharge Instructions (Signed)

## 2019-07-07 NOTE — Discharge Summary (Signed)
Physician Discharge Summary  Dylan Blake ZWC:[id number] DOB: 1961-10-27 DOA: 07/06/2019  PCP: Isaac Bliss, Rayford Halsted, MD  Admit date: 07/06/2019 Discharge date: 07/07/2019  Time spent: 45 minutes  Recommendations for Outpatient Follow-up:  1. Follow up with PCP 1-2 weeks for evaluation of DM control   Discharge Diagnoses:  Principal Problem:   Chest pain Active Problems:   Dyslipidemia   Essential hypertension   Diabetes mellitus without complication [facility])   Discharge Condition: stable Diet recommendation: heart healthy carb modified  Filed Weights   07/06/19 1053 07/07/19 0536  Weight: 84.2 kg 83.3 kg    History of present illness:  Dylan Blake is a 58 y.o. male with medical history significant of HTN; HLD; and DM presented 7/26 with chest pain.  He woke up about 3AM and was uncomfortable in his chest.  He noticed SOB and tried to move around to see if he could become more comfortable.  He got up, concerned that it was indigestion.  He took antacids without improvement.  He drank some Coke and took an aspirin.  He belched and had a BM but nothing improved the pain, so he finally decided to come to the ER.  The pain resolved completely about the time he arrived in the ER.  He is pain-free on admission.  Substernal pain.  He noted that he had a similar pain a week prior but less intense during his first hour at work; it also resolved spontaneously.  No h/o stress testing.  He is an active person, mowed his lawn day prior to presentation after working 8 hours.  His brother died from an MI in his 52s  Hospital Course:  Chest pain. Patient with substernal chest pressure 2 episodes - the first may have been exertional but the second was not.atypical. no further pain.  CXR unremarkable other than aortic atherosclerosis. High sensativity troponin negative x2. EKG not indicative of acute ischemia. HgA1c 7.7 lipid panel and TSH within the limits of normal. HEART PATHWAY score is 4,  indicating that the patient has an elevated risk score and requires further evaluation. Evaluated by cardiology who recommended coronary CT. Coronary CT significant for mild-moderate CAD. Cleared for discharge. Follow-up with cardiology as needed.  HTN  Controlled. Continue home Benicar  HLD Continue Crestor  DM Last A1c was 9.2% in January; 7.7% today. Continue home meds. Close OP follow up for optimal control   Procedures:  None  Consultations:  Cardiology  Discharge Exam: Vitals:   07/07/19 0536 07/07/19 1336  BP: 132/67 112/68  Pulse: 61 (!) 57  Resp: 18 18  Temp: 97.6 F (36.4 C) 97.9 F (36.6 C)  SpO2: 99% 100%    General: awake alert oriented x3 Cardiovascular: rrr no mgr no LE edeam Respiratory: normal effort BS clear bilaterally no wheeze Abdomen: ventral hernia is soft, non-tender  Discharge Instructions    Allergies as of 07/07/2019   No Known Allergies     Medication List    TAKE these medications   aspirin EC 81 MG tablet Take 1 tablet (81 mg total) by mouth daily.   dapagliflozin propanediol 10 MG Tabs tablet Commonly known as: FARXIGA Take 10 mg by mouth daily.   Dulaglutide 1.5 MG/0.5ML Sopn Commonly known as: Trulicity Inject 1.5 mg into the skin once a week.   ibuprofen 200 MG tablet Commonly known as: ADVIL Take 800 mg by mouth every 6 (six) hours as needed for pain.   metFORMIN 1000 MG tablet Commonly known as:  GLUCOPHAGE Take 1 tablet (1,000 mg total) by mouth 2 (two) times daily with a meal.   multivitamin tablet Take 1 tablet by mouth daily.   olmesartan 20 MG tablet Commonly known as: BENICAR Take 1 tablet (20 mg total) by mouth daily. Reported on 05/19/2016   rosuvastatin 20 MG tablet Commonly known as: CRESTOR Take 1 tablet (20 mg total) by mouth daily.      No Known Allergies Follow-up Information    Isaac Bliss, Rayford Halsted, MD.   Specialty: Internal Medicine Contact information: [place] [address] [phone]            The results of significant diagnostics from this hospitalization (including imaging, microbiology, ancillary and laboratory) are listed below for reference.    Significant Diagnostic Studies: Dg Chest 2 View  Result Date: 07/06/2019 CLINICAL DATA:  58 year old male with history of acute onset of chest pain at 3 p.m. on 07/05/2019. EXAM: CHEST - 2 VIEW COMPARISON:  Chest x-ray 01/07/2016. FINDINGS: Lung volumes are normal. No consolidative airspace disease. No pleural effusions. No pneumothorax. No pulmonary nodule or mass noted. Pulmonary vasculature and the cardiomediastinal silhouette are within normal limits. Aortic atherosclerosis. IMPRESSION: 1.  No radiographic evidence of acute cardiopulmonary disease. 2. Aortic atherosclerosis. Electronically Signed   By: Vinnie Langton M.D.   On: 07/06/2019 06:43   Ct Coronary Morph W/cta Cor W/score W/ca W/cm &/or Wo/cm  Addendum Date: 07/07/2019   ADDENDUM REPORT: 07/07/2019 15:23 EXAM: OVER-READ INTERPRETATION  CT CHEST The following report is an over-read performed by radiologist Dr. Norlene Duel [facility] on 07/07/2019. This over-read does not include interpretation of cardiac or coronary anatomy or pathology. The coronary calcium score and coronary CTA interpretation by the cardiologist is attached. COMPARISON:  None. FINDINGS: Cardiovascular: Normal heart size. No pericardial effusion. No vascular anomaly identified. Mediastinum: No mass or adenopathy identified. Lungs/pleura: Lungs are clear. No pleural effusion or airspace consolidation. Upper abdomen: No acute abnormality identified within the imaged portion of the upper abdomen. Musculoskeletal: Bilateral anterior rib deformities are identified which appear to be of varying ages and are likely posttraumatic IMPRESSION: 1. Bilateral anterior rib deformities of varying chronicity identified. Favor posttraumatic. Correlate for any  clinical history of trauma to the chest wall region. Electronically Signed   By: Kerby Moors M.D.   On: 07/07/2019 15:23   Result Date: 07/07/2019 CLINICAL DATA:  Chest pain EXAM: Cardiac CTA MEDICATIONS: Sub lingual nitro. 4mg  x 2 TECHNIQUE: The patient was scanned on a Siemens 188 slice scanner. Gantry rotation speed was 250 msecs. Collimation was 0.6 mm. A 100 kV prospective scan was triggered in the ascending thoracic aorta at 35-75% of the R-R interval. Average HR during the scan was 60 bpm. The 3D data set was interpreted on a dedicated work station using MPR, MIP and VRT modes. A total of 80cc of contrast was used. FINDINGS: Non-cardiac: See separate report from [facility]. Pulmonary veins drain normally to the left atrium. Calcium Score: 59.5 Agatston units. Coronary Arteries: Right dominant with no anomalies LM: No plaque or stenosis. LAD system: Mixed plaque in the proximal LAD just distal to D1, mild (<50%) stenosis. Circumflex system: No plaque or stenosis. RCA system: Mixed plaque in the proximal and distal RCA, minimal stenosis. IMPRESSION: 1. Coronary artery calcium score 59.5 Agatston units. This places the patient in the 70th percentile for age and gender, suggesting intermediate risk for future cardiac events. 2.  Nonobstructive coronary disease. [facility] technician Electronically Signed:  By: Loralie Champagne M.D. On: 07/07/2019 13:40    Microbiology: Recent Results (from the past 240 hour(s))  SARS Coronavirus 2 (CEPHEID - Performed in [facility] hospital lab), Hosp Order     Status: None   Collection Time: 07/06/19  8:23 AM   Specimen: Nasopharyngeal Swab  Result Value Ref Range Status   SARS Coronavirus 2 NEGATIVE NEGATIVE Final    Comment: (NOTE) If result is NEGATIVE SARS-CoV-2 target nucleic acids are NOT DETECTED. The SARS-CoV-2 RNA is generally detectable in upper and lower  respiratory specimens during the acute phase of infection. The lowest  concentration of  SARS-CoV-2 viral copies this assay can detect is 250  copies / mL. A negative result does not preclude SARS-CoV-2 infection  and should not be used as the sole basis for treatment or other  patient management decisions.  A negative result may occur with  improper specimen collection / handling, submission of specimen other  than nasopharyngeal swab, presence of viral mutation(s) within the  areas targeted by this assay, and inadequate number of viral copies  (<250 copies / mL). A negative result must be combined with clinical  observations, patient history, and epidemiological information. If result is POSITIVE SARS-CoV-2 target nucleic acids are DETECTED. The SARS-CoV-2 RNA is generally detectable in upper and lower  respiratory specimens dur ing the acute phase of infection.  Positive  results are indicative of active infection with SARS-CoV-2.  Clinical  correlation with patient history and other diagnostic information is  necessary to determine patient infection status.  Positive results do  not rule out bacterial infection or co-infection with other viruses. If result is PRESUMPTIVE POSTIVE SARS-CoV-2 nucleic acids MAY BE PRESENT.   A presumptive positive result was obtained on the submitted specimen  and confirmed on repeat testing.  While 2019 novel coronavirus  (SARS-CoV-2) nucleic acids may be present in the submitted sample  additional confirmatory testing may be necessary for epidemiological  and / or clinical management purposes  to differentiate between  SARS-CoV-2 and other Sarbecovirus currently known to infect humans.  If clinically indicated additional testing with an alternate test  methodology [phone]) is advised. The SARS-CoV-2 RNA is generally  detectable in upper and lower respiratory sp ecimens during the acute  phase of infection. The expected result is Negative. Fact Sheet for Patients:  StrictlyIdeas.no Fact Sheet for Healthcare  Providers: BankingDealers.co.za This test is not yet approved or cleared by the [place] FDA and has been authorized for detection and/or diagnosis of SARS-CoV-2 by FDA under an Emergency Use Authorization (EUA).  This EUA will remain in effect (meaning this test can be used) for the duration of the COVID-19 declaration under Section 564(b)(1) of the Act, 21 U.S.C. section 360bbb-3(b)(1), unless the authorization is terminated or revoked sooner. Performed at [facility], [place] [address] [place], [place] [address]      Labs: Basic Metabolic Panel: Recent Labs  Lab 07/06/19 0616  NA 138  K 4.9  CL 107  CO2 23  GLUCOSE 183*  BUN 8  CREATININE 0.84  CALCIUM 9.1   Liver Function Tests: No results for input(s): AST, ALT, ALKPHOS, BILITOT, PROT, ALBUMIN in the last 168 hours. No results for input(s): LIPASE, AMYLASE in the last 168 hours. No results for input(s): AMMONIA in the last 168 hours. CBC: Recent Labs  Lab 07/06/19 0616  WBC 3.7*  HGB 15.3  HCT 44.4  MCV 92.3  PLT 84*   Cardiac Enzymes: No results for  input(s): CKTOTAL, CKMB, CKMBINDEX, TROPONINI in the last 168 hours. BNP: BNP (last 3 results) No results for input(s): BNP in the last 8760 hours.  ProBNP (last 3 results) No results for input(s): PROBNP in the last 8760 hours.  CBG: Recent Labs  Lab 07/06/19 1543 07/06/19 2202 07/07/19 0734 07/07/19 1204 07/07/19 1608  GLUCAP 145* 148* 105* 132* 128*       Signed:  Cordelia Poche, MD [facility] 07/07/2019, 7:00 PM

## 2019-07-07 NOTE — Progress Notes (Addendum)
Progress Note  Patient Name: Dylan Blake Date of Encounter: 07/07/2019  Primary Cardiologist: New (Dr. Acie Fredrickson)  Subjective   No acute overnight events. Patient currently chest pain free. No palpitations, lightheadedness, or dizziness.   Coronary CT showed non-obstructive CAD.  Inpatient Medications    Scheduled Meds: . aspirin EC  81 mg Oral Daily  . enoxaparin (LOVENOX) injection  40 mg Subcutaneous Q24H  . insulin aspart  0-15 Units Subcutaneous TID WC  . insulin aspart  0-5 Units Subcutaneous QHS  . irbesartan  150 mg Oral Daily  . rosuvastatin  20 mg Oral Daily  . sodium chloride flush  3 mL Intravenous Once   Continuous Infusions:  PRN Meds: acetaminophen, ondansetron (ZOFRAN) IV   Vital Signs    Vitals:   07/06/19 1153 07/06/19 1423 07/06/19 2201 07/07/19 0536  BP: 133/66 131/73 104/62 132/67  Pulse:  64 64 61  Resp:   17 18  Temp:  (!) 97.5 F (36.4 C) 97.7 F (36.5 C) 97.6 F (36.4 C)  TempSrc:   Oral Oral  SpO2:  98%  99%  Weight:    83.3 kg  Height:        Intake/Output Summary (Last 24 hours) at 07/07/2019 0754 Last data filed at 07/06/2019 2000 Gross per 24 hour  Intake 640 ml  Output -  Net 640 ml   Last 3 Weights 07/07/2019 07/06/2019 12/24/2018  Weight (lbs) 183 lb 9.6 oz 185 lb 11.2 oz 199 lb  Weight (kg) 83.28 kg 84.233 kg 90.266 kg      Telemetry    Sinus rhythm with rates in the 50's to 80's. - Personally Reviewed  ECG    Normal sinus rhythm, rate 61 bpm, with non-specific IVCD and early repolarization changes. - Personally Reviewed  Physical Exam   GEN: Caucasian male resting comfortably in no acute distress.   Neck: Supple.  Cardiac: Mildly bradycardic with regular rhythm. No murmurs, rubs, or gallops.  Respiratory: Clear to auscultation bilaterally. No wheezes, rhonchi, or rales. GI: Abdomen soft, non-distended, and non-tender. Bowel sounds present. MS: No edema. No deformity. Neuro:  No focal deficits.  Psych: Normal  affect. Responds appropriately.  Labs    High Sensitivity Troponin:   Recent Labs  Lab 07/06/19 0616 07/06/19 0840  TROPONINIHS <2 4      Cardiac EnzymesNo results for input(s): TROPONINI in the last 168 hours. No results for input(s): TROPIPOC in the last 168 hours.   Chemistry Recent Labs  Lab 07/06/19 0616  NA 138  K 4.9  CL 107  CO2 23  GLUCOSE 183*  BUN 8  CREATININE 0.84  CALCIUM 9.1  GFRNONAA >60  GFRAA >60  ANIONGAP 8     Hematology Recent Labs  Lab 07/06/19 0616  WBC 3.7*  RBC 4.81  HGB 15.3  HCT 44.4  MCV 92.3  MCH 31.8  MCHC 34.5  RDW 12.6  PLT 84*    BNPNo results for input(s): BNP, PROBNP in the last 168 hours.   DDimer No results for input(s): DDIMER in the last 168 hours.   Radiology    Dg Chest 2 View  Result Date: 07/06/2019 CLINICAL DATA:  58 year old male with history of acute onset of chest pain at 3 p.m. on 07/05/2019. EXAM: CHEST - 2 VIEW COMPARISON:  Chest x-ray 01/07/2016. FINDINGS: Lung volumes are normal. No consolidative airspace disease. No pleural effusions. No pneumothorax. No pulmonary nodule or mass noted. Pulmonary vasculature and the cardiomediastinal silhouette are within normal  limits. Aortic atherosclerosis. IMPRESSION: 1.  No radiographic evidence of acute cardiopulmonary disease. 2. Aortic atherosclerosis. Electronically Signed   By: Vinnie Langton M.D.   On: 07/06/2019 06:43    Cardiac Studies   Coronary CT 07/07/2019: IMPRESSION: 1. Coronary artery calcium score 59.5 Agatston units. This places the patient in the 70th percentile for age and gender, suggesting intermediate risk for future cardiac events. 2.  Nonobstructive coronary disease.   Patient Profile   Mr. Dylan Blake is a 58 y.o. male with a history of hypertension, hyperlipidemia, type 2 diabetes and a family history of CAD who is being seen today for evaluation of chest tightness at the request of Dr. Lorin Blake.  Assessment & Plan    Chest Pain -  Patient presented with chest tightness and shortness of breath. - EKG shows no acute ischemic changes. - High-sensitivity troponin negative x2.  - Coronary CT today showed non-obstructive CAD and coronary artery calcium score of 59.5 (70th percentile for age and gender). - Continue home Aspirin 81mg  daily and Crestor 20mg  daily. Patient has been mildly bradycardic at times so will defer beta-blocker to MD.  Hypertension - BP currently well controlled at 132/67. - Patient on Olmestartan 20mg  daily at home. Using Irbesartan while admitted.   Hyperlipidemia - Lipid panel this admission: Total Cholesterol 117, Triglycerides 38, HDL 55, LDL 54. - Continue home Crestor 20mg  daily.  Type 2 Diabetes Mellitus - Patient on Metformin and Farxiga at home. - Management per primary team.  For questions or updates, please contact [place] Please consult [email or web address] for contact info under        Signed, Darreld Mclean, PA-C  07/07/2019, 7:54 AM    Attending Note:   The patient was seen and examined.  Agree with assessment and plan as noted above.  Changes made to the above note as needed.  Patient seen and independently examined with  Sande Rives, PA .   We discussed all aspects of the encounter. I agree with the assessment and plan as stated above.  1.  Chest pain :  troponins are negative.   Coronary CT angiogram does not show significant CAD.   Shows mild  - mod CAD .   He is stable for DC tonight . He needs a work note to excuse him from work today   Would be happy to see him for follow up if needed.   Cont. ASA and Crestor   2.  Hyperlipidemia:  Cont. crestor  Follow up with his primary   3.  HTN:  Continue current meds.    I have spent a total of 40 minutes with patient reviewing hospital  notes , telemetry, EKGs, labs and examining patient as well as establishing an assessment and plan that was discussed with the patient. > 50% of time was spent in direct patient  care.     Thayer Headings, Brooke Bonito., MD, [facility] 07/07/2019, 5:11 PM [address],  [place] Pager [phone]

## 2019-09-25 ENCOUNTER — Other Ambulatory Visit: Payer: Self-pay | Admitting: Internal Medicine

## 2019-09-25 MED ORDER — DAPAGLIFLOZIN PROPANEDIOL 10 MG PO TABS: 10.0000 mg | ORAL_TABLET | Freq: Every day | ORAL | 1 refills | Status: DC

## 2019-09-25 MED ORDER — ROSUVASTATIN CALCIUM 20 MG PO TABS: 20.0000 mg | ORAL_TABLET | Freq: Every day | ORAL | 1 refills | Status: DC

## 2019-09-25 NOTE — Telephone Encounter (Signed)
Requested medication (s) are due for refill today: yes  Requested medication (s) are on the active medication list: yes   Last refill:  08/26/2018  Future visit scheduled:no  Notes to clinic: Patient is out of medication   Requested Prescriptions  Pending Prescriptions Disp Refills   rosuvastatin (CRESTOR) 20 MG tablet 90 tablet 3    Sig: Take 1 tablet (20 mg total) by mouth daily.     Cardiovascular:  Antilipid - Statins Passed - 09/25/2019 10:23 AM      Passed - Total Cholesterol in normal range and within 360 days    Cholesterol  Date Value Ref Range Status  07/06/2019 117 0 - 200 mg/dL Final         Passed - LDL in normal range and within 360 days    LDL Cholesterol  Date Value Ref Range Status  07/06/2019 54 0 - 99 mg/dL Final    Comment:           Total Cholesterol/HDL:CHD Risk Coronary Heart Disease Risk Table                     Men   Women  1/2 Average Risk   3.4   3.3  Average Risk       5.0   4.4  2 X Average Risk   9.6   7.1  3 X Average Risk  23.4   11.0        Use the calculated Patient Ratio above and the CHD Risk Table to determine the patient's CHD Risk.        ATP III CLASSIFICATION (LDL):  <100     mg/dL   Optimal  100-129  mg/dL   Near or Above                    Optimal  130-159  mg/dL   Borderline  160-189  mg/dL   High  >190     mg/dL   Very High Performed at [facility] [address], [place], [place] [address]          Passed - HDL in normal range and within 360 days    HDL  Date Value Ref Range Status  07/06/2019 55 >40 mg/dL Final         Passed - Triglycerides in normal range and within 360 days    Triglycerides  Date Value Ref Range Status  07/06/2019 38 <150 mg/dL Final         Passed - Patient is not pregnant      Passed - Valid encounter within last 12 months    Recent Outpatient Visits          9 months ago Diabetes mellitus without complication ([place])   [place] [facility],  Rayford [facility]sted, MD   1 year ago Diabetes mellitus without complication ([place])   [place] [facility], Doretha Sou, MD   1 year ago Diabetes mellitus without complication ([place])   [place] [facility], Doretha Sou, MD   1 year ago Essential hypertension   [facility], Doretha Sou, MD   1 year ago Encounter for preventive health examination   [facility] at [place], Doretha Sou, MD              dapagliflozin propanediol (FARXIGA) 10 MG TABS tablet 90 tablet 6    Sig: Take 10 mg by mouth daily.  Endocrinology:  Diabetes - SGLT2 Inhibitors Failed - 09/25/2019 10:23 AM      Failed - Valid encounter within last 6 months    Recent Outpatient Visits          9 months ago Diabetes mellitus without complication ([place])   [place] [facility], Rayford Halsted, MD   1 year ago Diabetes mellitus without complication ([place])   [place] [facility], Doretha Sou, MD   1 year ago Diabetes mellitus without complication ([place])   [place] [facility], Doretha Sou, MD   1 year ago Essential hypertension   [facility], Doretha Sou, MD   1 year ago Encounter for preventive health examination   [facility] at [place], Doretha Sou, MD             Passed - Cr in normal range and within 360 days    Creatinine, Ser  Date Value Ref Range Status  07/06/2019 0.84 0.61 - 1.24 mg/dL Final         Passed - LDL in normal range and within 360 days    LDL Cholesterol  Date Value Ref Range Status  07/06/2019 54 0 - 99 mg/dL Final    Comment:           Total Cholesterol/HDL:CHD Risk Coronary Heart Disease Risk Table                     Men   Women  1/2 Average Risk   3.4   3.3  Average Risk       5.0   4.4  2 X Average Risk   9.6   7.1  3 X Average Risk  23.4   11.0        Use the calculated Patient Ratio above  and the CHD Risk Table to determine the patient's CHD Risk.        ATP III CLASSIFICATION (LDL):  <100     mg/dL   Optimal  100-129  mg/dL   Near or Above                    Optimal  130-159  mg/dL   Borderline  160-189  mg/dL   High  >190     mg/dL   Very High Performed at [facility] [address], [place], [place] [address]          Passed - HBA1C is between 0 and 7.9 and within 180 days    Hgb A1c MFr Bld  Date Value Ref Range Status  07/06/2019 7.7 (H) 4.8 - 5.6 % Final    Comment:    (NOTE) Pre diabetes:          5.7%-6.4% Diabetes:              >6.4% Glycemic control for   <7.0% adults with diabetes          Passed - eGFR in normal range and within 360 days    GFR calc Af Amer  Date Value Ref Range Status  07/06/2019 >60 >60 mL/min Final   GFR calc non Af Amer  Date Value Ref Range Status  07/06/2019 >60 >60 mL/min Final   GFR  Date Value Ref Range Status  12/18/2017 108.26 >60.00 mL/min Final

## 2019-09-25 NOTE — Telephone Encounter (Signed)
Medication Refill - Medication: rosuvastatin (CRESTOR) 20 MG tablet, dapagliflozin propanediol (FARXIGA) 10 MG TABS tablet  Has the patient contacted their pharmacy? Yes.    Pt states he is completely out of medication and has requested from pharmacy.  Please refill as soon as possible.   Preferred Pharmacy:  [id number] - [place], [place] [phone] (Phone) [phone] (Fax)     Pt was advised that RX refills may take up to 3 business days. We ask that you follow-up with your pharmacy.

## 2019-10-06 ENCOUNTER — Other Ambulatory Visit: Payer: Self-pay | Admitting: Internal Medicine

## 2019-10-06 NOTE — Telephone Encounter (Signed)
Medication Refill - Medication: metFORMIN (GLUCOPHAGE) 1000 MG tablet   Has the patient contacted their pharmacy? Yes.   (Agent: If no, request that the patient contact the pharmacy for the refill.) (Agent: If yes, when and what did the pharmacy advise?)  Preferred Pharmacy (with phone number or street name): [id number] - [place], [place]  Agent: Please be advised that RX refills may take up to 3 business days. We ask that you follow-up with your pharmacy.

## 2019-10-06 NOTE — Telephone Encounter (Signed)
Metformin 1000 mg tablet ,prescription expired on 0915/2020

## 2019-10-07 MED ORDER — METFORMIN HCL 1000 MG PO TABS: 1000.0000 mg | ORAL_TABLET | Freq: Two times a day (BID) | ORAL | 0 refills | Status: DC

## 2019-12-02 ENCOUNTER — Other Ambulatory Visit: Payer: Self-pay | Admitting: Internal Medicine

## 2019-12-27 ENCOUNTER — Other Ambulatory Visit: Payer: Self-pay | Admitting: Internal Medicine

## 2020-01-05 ENCOUNTER — Telehealth: Payer: Self-pay | Admitting: Internal Medicine

## 2020-01-05 NOTE — Telephone Encounter (Signed)
Pt is in need of his medications metformin,rosuvastatin and dapagiliglozin  Pharm:  [facility]  [place]  Pt has an appointment in April and would like to see if he can get his medication.

## 2020-01-06 ENCOUNTER — Other Ambulatory Visit: Payer: Self-pay | Admitting: *Deleted

## 2020-01-06 DIAGNOSIS — E119 Type 2 diabetes mellitus without complications: Secondary | ICD-10-CM

## 2020-01-06 MED ORDER — METFORMIN HCL 1000 MG PO TABS: 1000.0000 mg | ORAL_TABLET | Freq: Two times a day (BID) | ORAL | 0 refills | Status: DC

## 2020-01-06 MED ORDER — ROSUVASTATIN CALCIUM 20 MG PO TABS: 20.0000 mg | ORAL_TABLET | Freq: Every day | ORAL | 0 refills | Status: DC

## 2020-01-06 MED ORDER — TRULICITY 1.5 MG/0.5ML ~~LOC~~ SOAJ: 1.5000 mg | SUBCUTANEOUS | 2 refills | Status: DC

## 2020-01-06 NOTE — Telephone Encounter (Signed)
Refills sent

## 2020-03-18 ENCOUNTER — Other Ambulatory Visit: Payer: Self-pay

## 2020-03-19 ENCOUNTER — Encounter: Payer: Self-pay | Admitting: Internal Medicine

## 2020-03-19 ENCOUNTER — Ambulatory Visit (INDEPENDENT_AMBULATORY_CARE_PROVIDER_SITE_OTHER): Payer: Commercial Managed Care - PPO | Admitting: Internal Medicine

## 2020-03-19 VITALS — BP 120/78 | HR 85 | Temp 97.4°F | Ht 71.0 in | Wt 186.7 lb

## 2020-03-19 DIAGNOSIS — I10 Essential (primary) hypertension: Secondary | ICD-10-CM | POA: Diagnosis not present

## 2020-03-19 DIAGNOSIS — Z0000 Encounter for general adult medical examination without abnormal findings: Secondary | ICD-10-CM | POA: Diagnosis not present

## 2020-03-19 DIAGNOSIS — H6122 Impacted cerumen, left ear: Secondary | ICD-10-CM

## 2020-03-19 DIAGNOSIS — E119 Type 2 diabetes mellitus without complications: Secondary | ICD-10-CM | POA: Diagnosis not present

## 2020-03-19 DIAGNOSIS — E785 Hyperlipidemia, unspecified: Secondary | ICD-10-CM

## 2020-03-19 LAB — CBC WITH DIFFERENTIAL/PLATELET
Basophils Absolute: 0.1 10*3/uL (ref 0.0–0.1)
Basophils Relative: 1 % (ref 0.0–3.0)
Eosinophils Absolute: 0.1 10*3/uL (ref 0.0–0.7)
Eosinophils Relative: 2.6 % (ref 0.0–5.0)
HCT: 44.4 % (ref 39.0–52.0)
Hemoglobin: 15.4 g/dL (ref 13.0–17.0)
Lymphocytes Relative: 20.3 % (ref 12.0–46.0)
Lymphs Abs: 1.1 10*3/uL (ref 0.7–4.0)
MCHC: 34.7 g/dL (ref 30.0–36.0)
MCV: 92.8 fl (ref 78.0–100.0)
Monocytes Absolute: 0.5 10*3/uL (ref 0.1–1.0)
Monocytes Relative: 10.1 % (ref 3.0–12.0)
Neutro Abs: 3.4 10*3/uL (ref 1.4–7.7)
Neutrophils Relative %: 66 % (ref 43.0–77.0)
Platelets: 100 10*3/uL — ABNORMAL LOW (ref 150.0–400.0)
RBC: 4.79 Mil/uL (ref 4.22–5.81)
RDW: 12.9 % (ref 11.5–15.5)
WBC: 5.2 10*3/uL (ref 4.0–10.5)

## 2020-03-19 LAB — COMPREHENSIVE METABOLIC PANEL
ALT: 17 U/L (ref 0–53)
AST: 22 U/L (ref 0–37)
Albumin: 4.3 g/dL (ref 3.5–5.2)
Alkaline Phosphatase: 58 U/L (ref 39–117)
BUN: 13 mg/dL (ref 6–23)
CO2: 27 mEq/L (ref 19–32)
Calcium: 9.2 mg/dL (ref 8.4–10.5)
Chloride: 103 mEq/L (ref 96–112)
Creatinine, Ser: 0.78 mg/dL (ref 0.40–1.50)
GFR: 102.53 mL/min (ref >60.00)
Glucose, Bld: 129 mg/dL — ABNORMAL HIGH (ref 70–99)
Potassium: 4.4 mEq/L (ref 3.5–5.1)
Sodium: 139 mEq/L (ref 135–145)
Total Bilirubin: 1.2 mg/dL (ref 0.2–1.2)
Total Protein: 7.2 g/dL (ref 6.0–8.3)

## 2020-03-19 LAB — LIPID PANEL
Cholesterol: 131 mg/dL (ref 0–200)
HDL: 53.6 mg/dL (ref >39.00)
LDL Cholesterol: 62 mg/dL (ref 0–99)
NonHDL: 77.34
Total CHOL/HDL Ratio: 2
Triglycerides: 77 mg/dL (ref 0.0–149.0)
VLDL: 15.4 mg/dL (ref 0.0–40.0)

## 2020-03-19 LAB — PSA: PSA: 0.35 ng/mL (ref 0.10–4.00)

## 2020-03-19 LAB — HEMOGLOBIN A1C: Hgb A1c MFr Bld: 7.9 % — ABNORMAL HIGH (ref 4.6–6.5)

## 2020-03-19 LAB — VITAMIN D 25 HYDROXY (VIT D DEFICIENCY, FRACTURES): VITD: 47.22 ng/mL (ref 30.00–100.00)

## 2020-03-19 LAB — VITAMIN B12: Vitamin B-12: 447 pg/mL (ref 211–911)

## 2020-03-19 LAB — TSH: TSH: 0.94 u[IU]/mL (ref 0.35–4.50)
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
## 2020-03-19 NOTE — Patient Instructions (Signed)
-Nice seeing you today!!  -Lab work today; will notify you once results are available.  -Will send referral for eye exam today.  -Make sure you get your COVID vaccines.   Preventive Care 22-57 Years Old, Male Preventive care refers to lifestyle choices and visits with your health care provider that can promote health and wellness. This includes:  A yearly physical exam. This is also called an annual well check.  Regular dental and eye exams.  Immunizations.  Screening for certain conditions.  Healthy lifestyle choices, such as eating a healthy diet, getting regular exercise, not using drugs or products that contain nicotine and tobacco, and limiting alcohol use. What can I expect for my preventive care visit? Physical exam Your health care provider will check:  Height and weight. These may be used to calculate body mass index (BMI), which is a measurement that tells if you are at a healthy weight.  Heart rate and blood pressure.  Your skin for abnormal spots. Counseling Your health care provider may ask you questions about:  Alcohol, tobacco, and drug use.  Emotional well-being.  Home and relationship well-being.  Sexual activity.  Eating habits.  Work and work Statistician. What immunizations do I need?  Influenza (flu) vaccine  This is recommended every year. Tetanus, diphtheria, and pertussis (Tdap) vaccine  You may need a Td booster every 10 years. Varicella (chickenpox) vaccine  You may need this vaccine if you have not already been vaccinated. Zoster (shingles) vaccine  You may need this after age 73. Measles, mumps, and rubella (MMR) vaccine  You may need at least one dose of MMR if you were born in 1957 or later. You may also need a second dose. Pneumococcal conjugate (PCV13) vaccine  You may need this if you have certain conditions and were not previously vaccinated. Pneumococcal polysaccharide (PPSV23) vaccine  You may need one or two doses  if you smoke cigarettes or if you have certain conditions. Meningococcal conjugate (MenACWY) vaccine  You may need this if you have certain conditions. Hepatitis A vaccine  You may need this if you have certain conditions or if you travel or work in places where you may be exposed to hepatitis A. Hepatitis B vaccine  You may need this if you have certain conditions or if you travel or work in places where you may be exposed to hepatitis B. Haemophilus influenzae type b (Hib) vaccine  You may need this if you have certain risk factors. Human papillomavirus (HPV) vaccine  If recommended by your health care provider, you may need three doses over 6 months. You may receive vaccines as individual doses or as more than one vaccine together in one shot (combination vaccines). Talk with your health care provider about the risks and benefits of combination vaccines. What tests do I need? Blood tests  Lipid and cholesterol levels. These may be checked every 5 years, or more frequently if you are over 25 years old.  Hepatitis C test.  Hepatitis B test. Screening  Lung cancer screening. You may have this screening every year starting at age 55 if you have a 30-pack-year history of smoking and currently smoke or have quit within the past 15 years.  Prostate cancer screening. Recommendations will vary depending on your family history and other risks.  Colorectal cancer screening. All adults should have this screening starting at age 47 and continuing until age 69. Your health care provider may recommend screening at age 69 if you are at increased risk. You  will have tests every 1-10 years, depending on your results and the type of screening test.  Diabetes screening. This is done by checking your blood sugar (glucose) after you have not eaten for a while (fasting). You may have this done every 1-3 years.  Sexually transmitted disease (STD) testing. Follow these instructions at home: Eating and  drinking  Eat a diet that includes fresh fruits and vegetables, whole grains, lean protein, and low-fat dairy products.  Take vitamin and mineral supplements as recommended by your health care provider.  Do not drink alcohol if your health care provider tells you not to drink.  If you drink alcohol: ? Limit how much you have to 0-2 drinks a day. ? Be aware of how much alcohol is in your drink. In the [place] one drink equals one 12 oz bottle of beer (355 mL), one 5 oz glass of wine (148 mL), or one 1 oz glass of hard liquor (44 mL). Lifestyle  Take daily care of your teeth and gums.  Stay active. Exercise for at least 30 minutes on 5 or more days each week.  Do not use any products that contain nicotine or tobacco, such as cigarettes, e-cigarettes, and chewing tobacco. If you need help quitting, ask your health care provider.  If you are sexually active, practice safe sex. Use a condom or other form of protection to prevent STIs (sexually transmitted infections).  Talk with your health care provider about taking a low-dose aspirin every day starting at age 50. What's next?  Go to your health care provider once a year for a well check visit.  Ask your health care provider how often you should have your eyes and teeth checked.  Stay up to date on all vaccines. This information is not intended to replace advice given to you by your health care provider. Make sure you discuss any questions you have with your health care provider. Document Revised: 11/21/2018 Document Reviewed: 11/21/2018 Elsevier Patient Education  [place].   Diabetes Mellitus and Nutrition, Adult When you have diabetes (diabetes mellitus), it is very important to have healthy eating habits because your blood sugar (glucose) levels are greatly affected by what you eat and drink. Eating healthy foods in the appropriate amounts, at about the same times every day, can help you:  Control your blood  glucose.  Lower your risk of heart disease.  Improve your blood pressure.  Reach or maintain a healthy weight. Every person with diabetes is different, and each person has different needs for a meal plan. Your health care provider may recommend that you work with a diet and nutrition specialist (dietitian) to make a meal plan that is best for you. Your meal plan may vary depending on factors such as:  The calories you need.  The medicines you take.  Your weight.  Your blood glucose, blood pressure, and cholesterol levels.  Your activity level.  Other health conditions you have, such as heart or kidney disease. How do carbohydrates affect me? Carbohydrates, also called carbs, affect your blood glucose level more than any other type of food. Eating carbs naturally raises the amount of glucose in your blood. Carb counting is a method for keeping track of how many carbs you eat. Counting carbs is important to keep your blood glucose at a healthy level, especially if you use insulin or take certain oral diabetes medicines. It is important to know how many carbs you can safely have in each meal. This is different  for every person. Your dietitian can help you calculate how many carbs you should have at each meal and for each snack. Foods that contain carbs include:  Bread, cereal, rice, pasta, and crackers.  Potatoes and corn.  Peas, beans, and lentils.  Milk and yogurt.  Fruit and juice.  Desserts, such as cakes, cookies, ice cream, and candy. How does alcohol affect me? Alcohol can cause a sudden decrease in blood glucose (hypoglycemia), especially if you use insulin or take certain oral diabetes medicines. Hypoglycemia can be a life-threatening condition. Symptoms of hypoglycemia (sleepiness, dizziness, and confusion) are similar to symptoms of having too much alcohol. If your health care provider says that alcohol is safe for you, follow these guidelines:  Limit alcohol intake to  no more than 1 drink per day for nonpregnant women and 2 drinks per day for men. One drink equals 12 oz of beer, 5 oz of wine, or 1 oz of hard liquor.  Do not drink on an empty stomach.  Keep yourself hydrated with water, diet soda, or unsweetened iced tea.  Keep in mind that regular soda, juice, and other mixers may contain a lot of sugar and must be counted as carbs. What are tips for following this plan?  Reading food labels  Start by checking the serving size on the "Nutrition Facts" label of packaged foods and drinks. The amount of calories, carbs, fats, and other nutrients listed on the label is based on one serving of the item. Many items contain more than one serving per package.  Check the total grams (g) of carbs in one serving. You can calculate the number of servings of carbs in one serving by dividing the total carbs by 15. For example, if a food has 30 g of total carbs, it would be equal to 2 servings of carbs.  Check the number of grams (g) of saturated and trans fats in one serving. Choose foods that have low or no amount of these fats.  Check the number of milligrams (mg) of salt (sodium) in one serving. Most people should limit total sodium intake to less than 2,300 mg per day.  Always check the nutrition information of foods labeled as "low-fat" or "nonfat". These foods may be higher in added sugar or refined carbs and should be avoided.  Talk to your dietitian to identify your daily goals for nutrients listed on the label. Shopping  Avoid buying canned, premade, or processed foods. These foods tend to be high in fat, sodium, and added sugar.  Shop around the outside edge of the grocery store. This includes fresh fruits and vegetables, bulk grains, fresh meats, and fresh dairy. Cooking  Use low-heat cooking methods, such as baking, instead of high-heat cooking methods like deep frying.  Cook using healthy oils, such as olive, canola, or sunflower oil.  Avoid  cooking with butter, cream, or high-fat meats. Meal planning  Eat meals and snacks regularly, preferably at the same times every day. Avoid going long periods of time without eating.  Eat foods high in fiber, such as fresh fruits, vegetables, beans, and whole grains. Talk to your dietitian about how many servings of carbs you can eat at each meal.  Eat 4-6 ounces (oz) of lean protein each day, such as lean meat, chicken, fish, eggs, or tofu. One oz of lean protein is equal to: ? 1 oz of meat, chicken, or fish. ? 1 egg. ?  cup of tofu.  Eat some foods each day that contain  healthy fats, such as avocado, nuts, seeds, and fish. Lifestyle  Check your blood glucose regularly.  Exercise regularly as told by your health care provider. This may include: ? 150 minutes of moderate-intensity or vigorous-intensity exercise each week. This could be brisk walking, biking, or water aerobics. ? Stretching and doing strength exercises, such as yoga or weightlifting, at least 2 times a week.  Take medicines as told by your health care provider.  Do not use any products that contain nicotine or tobacco, such as cigarettes and e-cigarettes. If you need help quitting, ask your health care provider.  Work with a Social worker or diabetes educator to identify strategies to manage stress and any emotional and social challenges. Questions to ask a health care provider  Do I need to meet with a diabetes educator?  Do I need to meet with a dietitian?  What number can I call if I have questions?  When are the best times to check my blood glucose? Where to find more information:  American Diabetes Association: diabetes.org  Academy of Nutrition and Dietetics: www.eatright.[employer] of Diabetes and Digestive and Kidney Diseases (NIH): [employer] Summary  A healthy meal plan will help you control your blood glucose and maintain a healthy lifestyle.  Working with a diet and nutrition  specialist (dietitian) can help you make a meal plan that is best for you.  Keep in mind that carbohydrates (carbs) and alcohol have immediate effects on your blood glucose levels. It is important to count carbs and to use alcohol carefully. This information is not intended to replace advice given to you by your health care provider. Make sure you discuss any questions you have with your health care provider. Document Revised: 11/09/2017 Document Reviewed: 01/01/2017 Elsevier Patient Education  2020 [employer].

## 2020-03-19 NOTE — Addendum Note (Signed)
Addended by: Westley Hummer B on: 03/19/2020 01:29 PM   Modules accepted: Orders

## 2020-03-19 NOTE — Addendum Note (Signed)
Addended by: Suzette Battiest on: 03/19/2020 11:02 AM   Modules accepted: Orders

## 2020-04-05 ENCOUNTER — Other Ambulatory Visit: Payer: Self-pay | Admitting: Internal Medicine

## 2020-06-09 ENCOUNTER — Other Ambulatory Visit: Payer: Self-pay | Admitting: Internal Medicine

## 2020-06-18 ENCOUNTER — Ambulatory Visit: Payer: Commercial Managed Care - PPO | Admitting: Internal Medicine

## 2020-07-13 ENCOUNTER — Other Ambulatory Visit: Payer: Self-pay | Admitting: Internal Medicine

## 2020-07-16 ENCOUNTER — Ambulatory Visit: Payer: Commercial Managed Care - PPO | Admitting: Internal Medicine

## 2020-07-16 ENCOUNTER — Other Ambulatory Visit: Payer: Self-pay

## 2020-07-16 ENCOUNTER — Encounter: Payer: Self-pay | Admitting: Internal Medicine

## 2020-07-16 VITALS — BP 128/80 | HR 81 | Temp 97.9°F | Ht 71.0 in | Wt 189.6 lb

## 2020-07-16 DIAGNOSIS — E785 Hyperlipidemia, unspecified: Secondary | ICD-10-CM | POA: Diagnosis not present

## 2020-07-16 DIAGNOSIS — I10 Essential (primary) hypertension: Secondary | ICD-10-CM | POA: Diagnosis not present

## 2020-07-16 DIAGNOSIS — E119 Type 2 diabetes mellitus without complications: Secondary | ICD-10-CM | POA: Diagnosis not present

## 2020-07-16 LAB — POCT GLYCOSYLATED HEMOGLOBIN (HGB A1C): Hemoglobin A1C: 7 % — AB (ref 4.0–5.6)
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
## 2020-07-16 NOTE — Patient Instructions (Signed)
-  Nice seeing you today!!  -See you back in 3 months. 

## 2020-10-19 ENCOUNTER — Other Ambulatory Visit: Payer: Self-pay | Admitting: Internal Medicine

## 2020-10-19 DIAGNOSIS — E119 Type 2 diabetes mellitus without complications: Secondary | ICD-10-CM

## 2020-10-22 ENCOUNTER — Other Ambulatory Visit: Payer: Self-pay | Admitting: Internal Medicine

## 2020-10-22 ENCOUNTER — Ambulatory Visit: Payer: Commercial Managed Care - PPO | Admitting: Internal Medicine

## 2020-12-08 IMAGING — CT CT HEAR MORPH WITH CTA COR WITH SCORE WITH CA WITH CONTRAST AND
4 of 7 series · 8 of 20 positions shown, 9 images · IV contrast (APPLIED)
Comparison: None.

Addendum:
CLINICAL DATA: Chest pain

EXAM:
Cardiac CTA
MEDICATIONS:
Sub lingual nitro. 4mg x 2
TECHNIQUE: The patient was scanned on a Siemens [REDACTED]ice scanner. Gantry
rotation speed was 250 msecs. Collimation was 0.6 mm. A 100 kV
prospective scan was triggered in the ascending thoracic aorta at
35-75% of the R-R interval. Average HR during the scan was 60 bpm.
The 3D data set was interpreted on a dedicated work station using
MPR, MIP and VRT modes. A total of 80cc of contrast was used.

[Series 7: best diast 74 % · axial · 0.39mm/px · z∈[+1183,+1228]mm · 2 of 336 slices shown, 3 images]
[im 112/336  vessel]
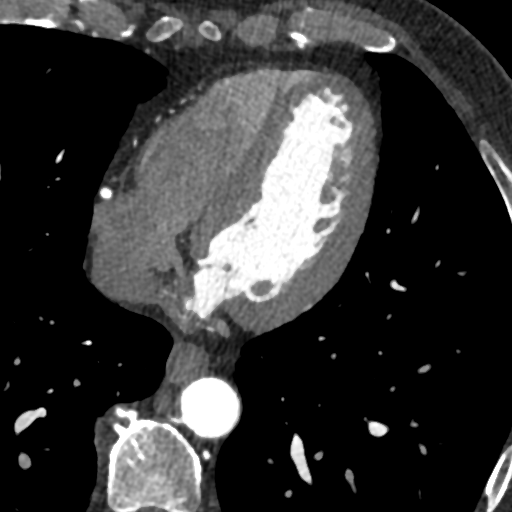
[im 112/336  lung]
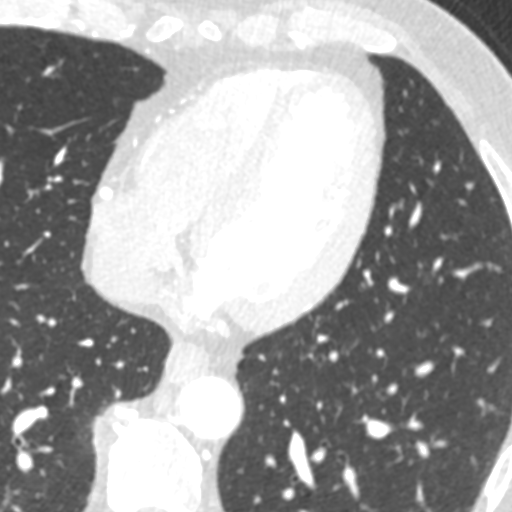
[im 224/336  vessel]
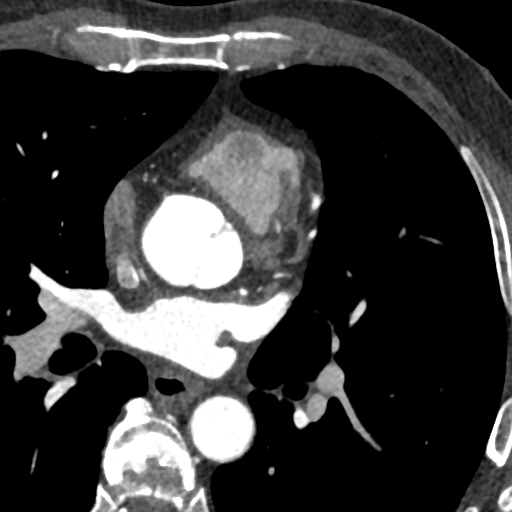

[Series 8: best syst 35 % · axial · 0.39mm/px · z∈[+1183,+1228]mm · 2 of 336 slices shown]
[im 112/336  vessel]
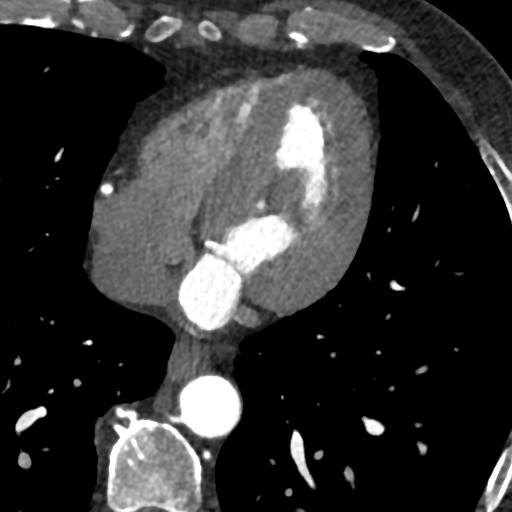
[im 224/336  vessel]
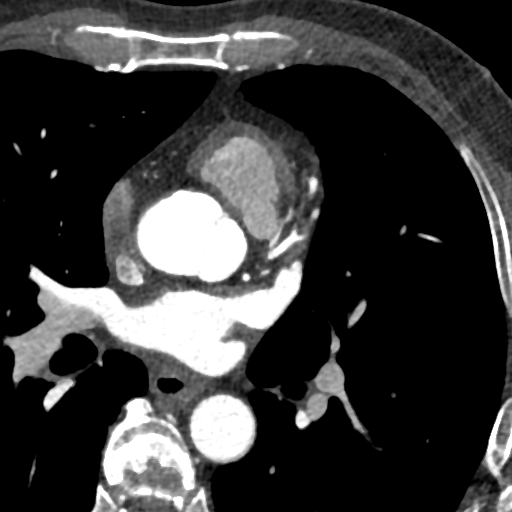

[Series 9: ts diast sharp 74 % · axial · 0.39mm/px · z∈[+1183,+1228]mm · 2 of 336 slices shown]
[im 112/336  lung]
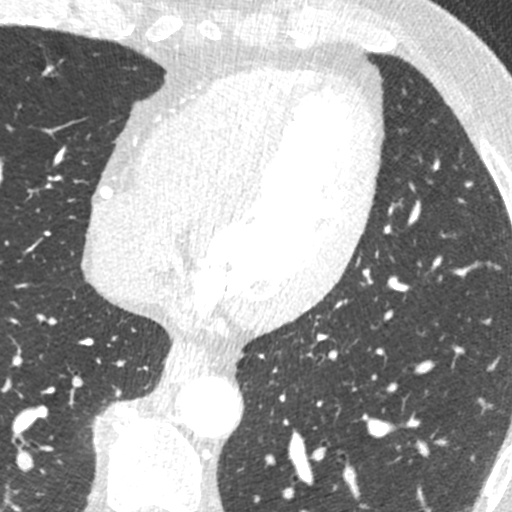
[im 224/336  lung]
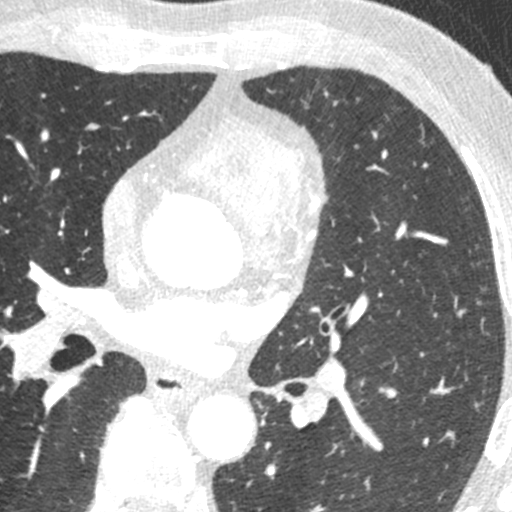

[Series 10: ts syst sharp 35 % · axial · 0.39mm/px · z∈[+1183,+1228]mm · 2 of 336 slices shown]
[im 112/336  lung]
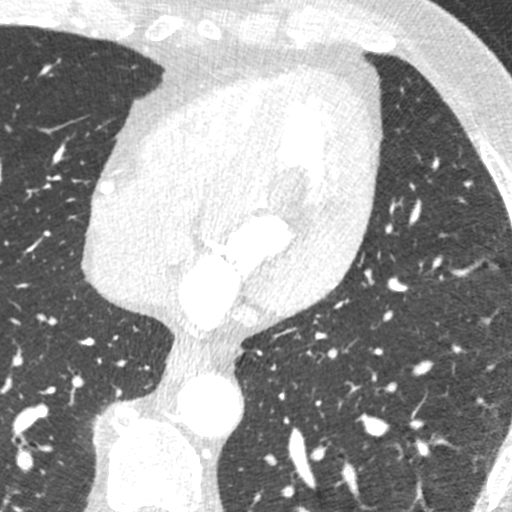
[im 224/336  lung]
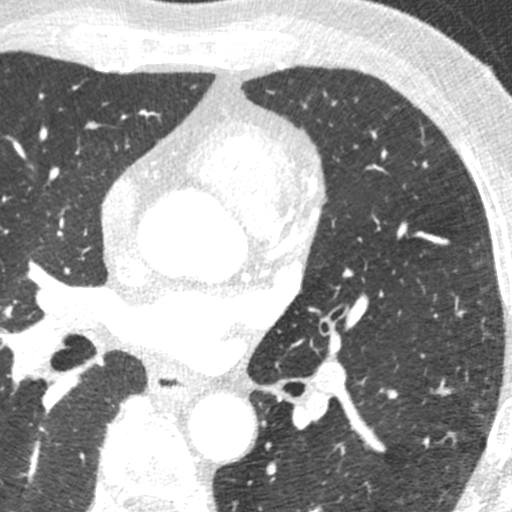

[8 of 20 positions shown; findings below may reference images not displayed]

FINDINGS: Non-cardiac: See separate report from [REDACTED].

Pulmonary veins drain normally to the left atrium.

Calcium Score: 59.5 Agatston units.

Coronary Arteries: Right dominant with no anomalies

LM: No plaque or stenosis.

LAD system: Mixed plaque in the proximal LAD just distal to D1, mild
(<50%) stenosis.

Circumflex system: No plaque or stenosis.

RCA system: Mixed plaque in the proximal and distal RCA, minimal
stenosis.
IMPRESSION: 1. Coronary artery calcium score 59.5 Agatston units. This places
the patient in the 70th percentile for age and gender, suggesting
intermediate risk for future cardiac events.

2.  Nonobstructive coronary disease.

Relindas Auad

EXAM:
OVER-READ INTERPRETATION  CT CHEST

The following report is an over-read performed by radiologist Dr.
over-read does not include interpretation of cardiac or coronary
anatomy or pathology. The coronary calcium score and coronary CTA
interpretation by the cardiologist is attached.
FINDINGS: Cardiovascular: Normal heart size. No pericardial effusion. No
vascular anomaly identified.

Mediastinum: No mass or adenopathy identified.

Lungs/pleura: Lungs are clear. No pleural effusion or airspace
consolidation.

Upper abdomen: No acute abnormality identified within the imaged
portion of the upper abdomen.

Musculoskeletal: Bilateral anterior rib deformities are identified
which appear to be of varying ages and are likely posttraumatic
IMPRESSION: 1. Bilateral anterior rib deformities of varying chronicity
identified. Favor posttraumatic. Correlate for any clinical history
of trauma to the chest wall region.

*** End of Addendum ***
FINDINGS: Non-cardiac: See separate report from [REDACTED].

Pulmonary veins drain normally to the left atrium.

Calcium Score: 59.5 Agatston units.

Coronary Arteries: Right dominant with no anomalies

LM: No plaque or stenosis.

LAD system: Mixed plaque in the proximal LAD just distal to D1, mild
(<50%) stenosis.

Circumflex system: No plaque or stenosis.

RCA system: Mixed plaque in the proximal and distal RCA, minimal
stenosis.
IMPRESSION: 1. Coronary artery calcium score 59.5 Agatston units. This places
the patient in the 70th percentile for age and gender, suggesting
intermediate risk for future cardiac events.

2.  Nonobstructive coronary disease.

Relindas Auad

## 2021-02-02 ENCOUNTER — Other Ambulatory Visit: Payer: Self-pay | Admitting: Internal Medicine

## 2021-05-20 ENCOUNTER — Other Ambulatory Visit: Payer: Self-pay | Admitting: Internal Medicine

## 2021-07-07 ENCOUNTER — Other Ambulatory Visit: Payer: Self-pay

## 2021-07-08 ENCOUNTER — Ambulatory Visit (INDEPENDENT_AMBULATORY_CARE_PROVIDER_SITE_OTHER): Payer: Commercial Managed Care - PPO | Admitting: Internal Medicine

## 2021-07-08 ENCOUNTER — Other Ambulatory Visit: Payer: Self-pay | Admitting: Internal Medicine

## 2021-07-08 ENCOUNTER — Encounter: Payer: Self-pay | Admitting: Internal Medicine

## 2021-07-08 VITALS — BP 140/90 | HR 75 | Temp 98.0°F | Ht 71.0 in | Wt 184.1 lb

## 2021-07-08 DIAGNOSIS — E119 Type 2 diabetes mellitus without complications: Secondary | ICD-10-CM | POA: Diagnosis not present

## 2021-07-08 DIAGNOSIS — E785 Hyperlipidemia, unspecified: Secondary | ICD-10-CM | POA: Diagnosis not present

## 2021-07-08 DIAGNOSIS — Z23 Encounter for immunization: Secondary | ICD-10-CM

## 2021-07-08 DIAGNOSIS — I10 Essential (primary) hypertension: Secondary | ICD-10-CM | POA: Diagnosis not present

## 2021-07-08 DIAGNOSIS — Z0000 Encounter for general adult medical examination without abnormal findings: Secondary | ICD-10-CM

## 2021-07-08 LAB — COMPREHENSIVE METABOLIC PANEL
ALT: 15 U/L (ref 0–53)
AST: 21 U/L (ref 0–37)
Albumin: 4 g/dL (ref 3.5–5.2)
Alkaline Phosphatase: 56 U/L (ref 39–117)
BUN: 11 mg/dL (ref 6–23)
CO2: 28 mEq/L (ref 19–32)
Calcium: 9.2 mg/dL (ref 8.4–10.5)
Chloride: 105 mEq/L (ref 96–112)
Creatinine, Ser: 0.88 mg/dL (ref 0.40–1.50)
GFR: 94.81 mL/min (ref >60.00)
Glucose, Bld: 126 mg/dL — ABNORMAL HIGH (ref 70–99)
Potassium: 4.4 mEq/L (ref 3.5–5.1)
Sodium: 141 mEq/L (ref 135–145)
Total Bilirubin: 1 mg/dL (ref 0.2–1.2)
Total Protein: 7.1 g/dL (ref 6.0–8.3)

## 2021-07-08 LAB — VITAMIN D 25 HYDROXY (VIT D DEFICIENCY, FRACTURES): VITD: 57.45 ng/mL (ref 30.00–100.00)

## 2021-07-08 LAB — CBC WITH DIFFERENTIAL/PLATELET
Basophils Absolute: 0 10*3/uL (ref 0.0–0.1)
Basophils Relative: 1.1 % (ref 0.0–3.0)
Eosinophils Absolute: 0.1 10*3/uL (ref 0.0–0.7)
Eosinophils Relative: 3.1 % (ref 0.0–5.0)
HCT: 45.1 % (ref 39.0–52.0)
Hemoglobin: 15.4 g/dL (ref 13.0–17.0)
Lymphocytes Relative: 23.2 % (ref 12.0–46.0)
Lymphs Abs: 0.8 10*3/uL (ref 0.7–4.0)
MCHC: 34.1 g/dL (ref 30.0–36.0)
MCV: 93.9 fl (ref 78.0–100.0)
Monocytes Absolute: 0.4 10*3/uL (ref 0.1–1.0)
Monocytes Relative: 11.6 % (ref 3.0–12.0)
Neutro Abs: 2.2 10*3/uL (ref 1.4–7.7)
Neutrophils Relative %: 61 % (ref 43.0–77.0)
Platelets: 103 10*3/uL — ABNORMAL LOW (ref 150.0–400.0)
RBC: 4.8 Mil/uL (ref 4.22–5.81)
RDW: 13.4 % (ref 11.5–15.5)
WBC: 3.6 10*3/uL — ABNORMAL LOW (ref 4.0–10.5)

## 2021-07-08 LAB — LIPID PANEL
Cholesterol: 181 mg/dL (ref 0–200)
HDL: 63.5 mg/dL (ref >39.00)
LDL Cholesterol: 107 mg/dL — ABNORMAL HIGH (ref 0–99)
NonHDL: 117.71
Total CHOL/HDL Ratio: 3
Triglycerides: 52 mg/dL (ref 0.0–149.0)
VLDL: 10.4 mg/dL (ref 0.0–40.0)

## 2021-07-08 LAB — HEMOGLOBIN A1C: Hgb A1c MFr Bld: 7.4 % — ABNORMAL HIGH (ref 4.6–6.5)

## 2021-07-08 LAB — VITAMIN B12: Vitamin B-12: 228 pg/mL (ref 211–911)

## 2021-07-08 LAB — TSH: TSH: 1.54 u[IU]/mL (ref 0.35–5.50)

## 2021-07-08 MED ORDER — ROSUVASTATIN CALCIUM 40 MG PO TABS: 40.0000 mg | ORAL_TABLET | Freq: Every day | ORAL | 1 refills | Status: DC

## 2021-07-08 NOTE — Addendum Note (Signed)
Addended by: Westley Hummer B on: 07/08/2021 08:39 AM   Modules accepted: Orders

## 2021-07-08 NOTE — Addendum Note (Signed)
Addended by: Amanda Cockayne on: 07/08/2021 08:40 AM   Modules accepted: Orders
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
## 2021-07-08 NOTE — Patient Instructions (Signed)
-  Nice seeing you today!!  -Lab work today; will notify you once results are available.  -Remember to schedule your eye exam.  -Tetanus and first shingles vaccines today.  -Schedule follow up in 3 months.

## 2021-07-11 ENCOUNTER — Other Ambulatory Visit: Payer: Self-pay | Admitting: Internal Medicine

## 2021-07-26 ENCOUNTER — Other Ambulatory Visit: Payer: Self-pay | Admitting: Internal Medicine

## 2021-07-26 DIAGNOSIS — E119 Type 2 diabetes mellitus without complications: Secondary | ICD-10-CM

## 2021-08-28 ENCOUNTER — Other Ambulatory Visit: Payer: Self-pay | Admitting: Internal Medicine

## 2021-10-07 ENCOUNTER — Ambulatory Visit: Payer: Commercial Managed Care - PPO | Admitting: Internal Medicine

## 2021-10-27 ENCOUNTER — Other Ambulatory Visit: Payer: Self-pay

## 2021-10-27 ENCOUNTER — Encounter (HOSPITAL_BASED_OUTPATIENT_CLINIC_OR_DEPARTMENT_OTHER): Payer: Self-pay

## 2021-10-27 ENCOUNTER — Emergency Department (HOSPITAL_BASED_OUTPATIENT_CLINIC_OR_DEPARTMENT_OTHER)
Admission: EM | Admit: 2021-10-27 | Discharge: 2021-10-27 | Disposition: A | Discharge status: Home / Self Care | Payer: Commercial Managed Care - PPO | Attending: Emergency Medicine | Admitting: Emergency Medicine

## 2021-10-27 DIAGNOSIS — E119 Type 2 diabetes mellitus without complications: Secondary | ICD-10-CM | POA: Insufficient documentation

## 2021-10-27 DIAGNOSIS — D235 Other benign neoplasm of skin of trunk: Secondary | ICD-10-CM | POA: Diagnosis not present

## 2021-10-27 DIAGNOSIS — I10 Essential (primary) hypertension: Secondary | ICD-10-CM | POA: Diagnosis not present

## 2021-10-27 DIAGNOSIS — L0211 Cutaneous abscess of neck: Secondary | ICD-10-CM | POA: Insufficient documentation

## 2021-10-27 DIAGNOSIS — Z7982 Long term (current) use of aspirin: Secondary | ICD-10-CM | POA: Insufficient documentation

## 2021-10-27 DIAGNOSIS — Z794 Long term (current) use of insulin: Secondary | ICD-10-CM | POA: Diagnosis not present

## 2021-10-27 DIAGNOSIS — Z7984 Long term (current) use of oral hypoglycemic drugs: Secondary | ICD-10-CM | POA: Diagnosis not present

## 2021-10-27 MED ORDER — IBUPROFEN 800 MG PO TABS
800.0000 mg | ORAL_TABLET | Freq: Once | ORAL | Status: AC
  Administered 2021-10-27: 20:00:00 800 mg via ORAL
  Filled 2021-10-27: qty 1

## 2021-10-27 MED ORDER — OXYCODONE HCL 5 MG PO TABS: 5.0000 mg | ORAL_TABLET | Freq: Four times a day (QID) | ORAL | 0 refills | Status: DC | PRN

## 2021-10-27 MED ORDER — DOXYCYCLINE HYCLATE 100 MG PO TABS
100.0000 mg | ORAL_TABLET | Freq: Once | ORAL | Status: AC
  Administered 2021-10-27: 21:00:00 100 mg via ORAL
  Filled 2021-10-27: qty 1

## 2021-10-27 MED ORDER — ACETAMINOPHEN 500 MG PO TABS
1000.0000 mg | ORAL_TABLET | Freq: Once | ORAL | Status: AC
  Administered 2021-10-27: 20:00:00 1000 mg via ORAL
  Filled 2021-10-27: qty 2

## 2021-10-27 MED ORDER — LIDOCAINE HCL (PF) 1 % IJ SOLN
10.0000 mL | Freq: Once | INTRAMUSCULAR | Status: AC
  Administered 2021-10-27: 20:00:00 10 mL via INTRADERMAL
  Filled 2021-10-27: qty 10

## 2021-10-27 MED ORDER — DOXYCYCLINE HYCLATE 100 MG PO CAPS: 100.0000 mg | ORAL_CAPSULE | Freq: Two times a day (BID) | ORAL | 0 refills | Status: DC

## 2021-10-27 NOTE — ED Triage Notes (Signed)
Patient here POV from home with Cyst.  Patient has had Cyst on Lower Posterior Neck for several years but it began to flare up yesterday causing increase in Pain.  NAD Noted during Triage. A&Ox4. GCS 15. Ambulatory. No Fevers.

## 2021-10-27 NOTE — ED Notes (Signed)
ED Provider at bedside for I&D.
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
## 2021-10-27 NOTE — Discharge Instructions (Addendum)
You had an infected cyst in your back.  We attempted to drain and remove some of the cyst material today, but I explained that this will likely reform.  We placed some packing in the wound to let it drain and stay open.  We also started you on antibiotics.  It is very important that you follow-up with a general surgeon to have the cyst looked at again.  The entire cyst may need to be removed surgically.  Please take the full course of antibiotics as prescribed.  If you cannot get into see general surgeon, you should revisit in urgent care or your primary care doctor's office by the end of the week to have the packing removed.  It should not stay in place for more than 5 days.

## 2021-10-28 ENCOUNTER — Ambulatory Visit: Payer: Commercial Managed Care - PPO | Admitting: Internal Medicine

## 2021-10-31 ENCOUNTER — Encounter: Payer: Self-pay | Admitting: Emergency Medicine

## 2021-10-31 ENCOUNTER — Ambulatory Visit
Admission: EM | Admit: 2021-10-31 | Discharge: 2021-10-31 | Disposition: A | Discharge status: Home / Self Care | Payer: Commercial Managed Care - PPO

## 2021-10-31 ENCOUNTER — Other Ambulatory Visit: Payer: Self-pay

## 2021-10-31 DIAGNOSIS — D369 Benign neoplasm, unspecified site: Secondary | ICD-10-CM
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
## 2021-10-31 NOTE — ED Triage Notes (Signed)
Patient states that he was seen at [facility] last Thursday, had a cyst removed on his neck, packed with idoform.  Needs this removed.

## 2021-12-15 ENCOUNTER — Encounter: Payer: Self-pay | Admitting: Internal Medicine

## 2021-12-15 ENCOUNTER — Other Ambulatory Visit: Payer: Self-pay | Admitting: Internal Medicine

## 2021-12-15 ENCOUNTER — Ambulatory Visit: Payer: Commercial Managed Care - PPO | Admitting: Internal Medicine

## 2021-12-15 VITALS — BP 134/80 | HR 80 | Temp 98.2°F | Ht 71.0 in | Wt 185.1 lb

## 2021-12-15 DIAGNOSIS — Z23 Encounter for immunization: Secondary | ICD-10-CM

## 2021-12-15 DIAGNOSIS — I10 Essential (primary) hypertension: Secondary | ICD-10-CM | POA: Diagnosis not present

## 2021-12-15 DIAGNOSIS — E1169 Type 2 diabetes mellitus with other specified complication: Secondary | ICD-10-CM

## 2021-12-15 DIAGNOSIS — E119 Type 2 diabetes mellitus without complications: Secondary | ICD-10-CM

## 2021-12-15 DIAGNOSIS — E785 Hyperlipidemia, unspecified: Secondary | ICD-10-CM

## 2021-12-15 LAB — LIPID PANEL
Cholesterol: 137 mg/dL (ref 0–200)
HDL: 59.7 mg/dL (ref >39.00)
LDL Cholesterol: 57 mg/dL (ref 0–99)
NonHDL: 77.42
Total CHOL/HDL Ratio: 2
Triglycerides: 104 mg/dL (ref 0.0–149.0)
VLDL: 20.8 mg/dL (ref 0.0–40.0)

## 2021-12-15 LAB — POCT GLYCOSYLATED HEMOGLOBIN (HGB A1C): Hemoglobin A1C: 8.6 % — AB (ref 4.0–5.6)

## 2021-12-15 MED ORDER — TRULICITY 3 MG/0.5ML ~~LOC~~ SOAJ: 3.0000 mg | SUBCUTANEOUS | 0 refills | Status: AC

## 2021-12-15 MED ORDER — OLMESARTAN MEDOXOMIL-HCTZ 40-25 MG PO TABS: 1.0000 | ORAL_TABLET | Freq: Every day | ORAL | 1 refills | Status: DC

## 2021-12-15 NOTE — Patient Instructions (Addendum)
-  Nice seeing you today!!  -Lab work today; will notify you once results are available.  -STOP olmesartan.  -Start olmesartan HCT 40/25 mg daily for your blood pressure.  -Increase Trulicity to 3 mg injected weekly.  -Second shingles vaccine today.  -Schedule follow up in 3 months.
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
## 2021-12-15 NOTE — Addendum Note (Signed)
Addended by: Nathanial Millman E on: 12/15/2021 07:30 AM   Modules accepted: Orders

## 2022-01-20 ENCOUNTER — Other Ambulatory Visit: Payer: Self-pay | Admitting: Internal Medicine

## 2022-01-20 DIAGNOSIS — E785 Hyperlipidemia, unspecified: Secondary | ICD-10-CM

## 2022-03-15 ENCOUNTER — Ambulatory Visit: Payer: Commercial Managed Care - PPO | Admitting: Internal Medicine

## 2022-03-18 ENCOUNTER — Other Ambulatory Visit: Payer: Self-pay | Admitting: Internal Medicine

## 2022-04-12 ENCOUNTER — Ambulatory Visit: Payer: Commercial Managed Care - PPO | Admitting: Internal Medicine

## 2022-04-19 ENCOUNTER — Other Ambulatory Visit: Payer: Self-pay | Admitting: Internal Medicine

## 2022-04-19 DIAGNOSIS — E119 Type 2 diabetes mellitus without complications: Secondary | ICD-10-CM

## 2022-08-22 ENCOUNTER — Other Ambulatory Visit: Payer: Self-pay | Admitting: Internal Medicine

## 2022-08-22 DIAGNOSIS — E119 Type 2 diabetes mellitus without complications: Secondary | ICD-10-CM

## 2022-12-08 ENCOUNTER — Other Ambulatory Visit: Payer: Self-pay | Admitting: Internal Medicine

## 2023-02-08 ENCOUNTER — Encounter: Payer: Self-pay | Admitting: Gastroenterology

## 2023-03-28 ENCOUNTER — Other Ambulatory Visit: Payer: Self-pay | Admitting: Internal Medicine

## 2023-05-09 ENCOUNTER — Ambulatory Visit (INDEPENDENT_AMBULATORY_CARE_PROVIDER_SITE_OTHER): Payer: Commercial Managed Care - PPO | Admitting: Internal Medicine

## 2023-05-09 ENCOUNTER — Other Ambulatory Visit: Payer: Self-pay | Admitting: *Deleted

## 2023-05-09 ENCOUNTER — Encounter: Payer: Self-pay | Admitting: Internal Medicine

## 2023-05-09 VITALS — BP 142/78 | HR 70 | Temp 97.9°F | Ht 71.0 in | Wt 177.6 lb

## 2023-05-09 DIAGNOSIS — Z7984 Long term (current) use of oral hypoglycemic drugs: Secondary | ICD-10-CM | POA: Diagnosis not present

## 2023-05-09 DIAGNOSIS — E1169 Type 2 diabetes mellitus with other specified complication: Secondary | ICD-10-CM | POA: Diagnosis not present

## 2023-05-09 DIAGNOSIS — E119 Type 2 diabetes mellitus without complications: Secondary | ICD-10-CM

## 2023-05-09 DIAGNOSIS — Z7985 Long-term (current) use of injectable non-insulin antidiabetic drugs: Secondary | ICD-10-CM | POA: Diagnosis not present

## 2023-05-09 DIAGNOSIS — I10 Essential (primary) hypertension: Secondary | ICD-10-CM

## 2023-05-09 DIAGNOSIS — Z0000 Encounter for general adult medical examination without abnormal findings: Secondary | ICD-10-CM | POA: Diagnosis not present

## 2023-05-09 DIAGNOSIS — K429 Umbilical hernia without obstruction or gangrene: Secondary | ICD-10-CM

## 2023-05-09 DIAGNOSIS — Z1159 Encounter for screening for other viral diseases: Secondary | ICD-10-CM

## 2023-05-09 DIAGNOSIS — Z1211 Encounter for screening for malignant neoplasm of colon: Secondary | ICD-10-CM

## 2023-05-09 DIAGNOSIS — E785 Hyperlipidemia, unspecified: Secondary | ICD-10-CM

## 2023-05-09 LAB — CBC WITH DIFFERENTIAL/PLATELET
Basophils Absolute: 0.1 10*3/uL (ref 0.0–0.1)
Basophils Relative: 1.3 % (ref 0.0–3.0)
Eosinophils Absolute: 0.1 10*3/uL (ref 0.0–0.7)
Eosinophils Relative: 3.6 % (ref 0.0–5.0)
HCT: 44.6 % (ref 39.0–52.0)
Hemoglobin: 15.1 g/dL (ref 13.0–17.0)
Lymphocytes Relative: 21.8 % (ref 12.0–46.0)
Lymphs Abs: 0.9 10*3/uL (ref 0.7–4.0)
MCHC: 33.9 g/dL (ref 30.0–36.0)
MCV: 93.1 fl (ref 78.0–100.0)
Monocytes Absolute: 0.5 10*3/uL (ref 0.1–1.0)
Monocytes Relative: 11.3 % (ref 3.0–12.0)
Neutro Abs: 2.6 10*3/uL (ref 1.4–7.7)
Neutrophils Relative %: 62 % (ref 43.0–77.0)
Platelets: 121 10*3/uL — ABNORMAL LOW (ref 150.0–400.0)
RBC: 4.79 Mil/uL (ref 4.22–5.81)
RDW: 13.1 % (ref 11.5–15.5)
WBC: 4.2 10*3/uL (ref 4.0–10.5)

## 2023-05-09 LAB — COMPREHENSIVE METABOLIC PANEL
ALT: 17 U/L (ref 0–53)
AST: 21 U/L (ref 0–37)
Albumin: 3.9 g/dL (ref 3.5–5.2)
Alkaline Phosphatase: 83 U/L (ref 39–117)
BUN: 10 mg/dL (ref 6–23)
CO2: 29 mEq/L (ref 19–32)
Calcium: 9.2 mg/dL (ref 8.4–10.5)
Chloride: 100 mEq/L (ref 96–112)
Creatinine, Ser: 0.78 mg/dL (ref 0.40–1.50)
GFR: 97.07 mL/min (ref >60.00)
Glucose, Bld: 268 mg/dL — ABNORMAL HIGH (ref 70–99)
Potassium: 4.5 mEq/L (ref 3.5–5.1)
Sodium: 139 mEq/L (ref 135–145)
Total Bilirubin: 1.4 mg/dL — ABNORMAL HIGH (ref 0.2–1.2)
Total Protein: 7.3 g/dL (ref 6.0–8.3)

## 2023-05-09 LAB — LIPID PANEL
Cholesterol: 186 mg/dL (ref 0–200)
HDL: 62.8 mg/dL (ref >39.00)
LDL Cholesterol: 108 mg/dL — ABNORMAL HIGH (ref 0–99)
NonHDL: 122.7
Total CHOL/HDL Ratio: 3
Triglycerides: 75 mg/dL (ref 0.0–149.0)
VLDL: 15 mg/dL (ref 0.0–40.0)

## 2023-05-09 LAB — HEMOGLOBIN A1C: Hgb A1c MFr Bld: 12.4 % — ABNORMAL HIGH (ref 4.6–6.5)

## 2023-05-09 LAB — VITAMIN D 25 HYDROXY (VIT D DEFICIENCY, FRACTURES): VITD: 61.8 ng/mL (ref 30.00–100.00)

## 2023-05-09 LAB — TSH: TSH: 1.73 u[IU]/mL (ref 0.35–5.50)

## 2023-05-09 LAB — MICROALBUMIN / CREATININE URINE RATIO
Creatinine,U: 90.6 mg/dL
Microalb Creat Ratio: 0.8 mg/g (ref 0.0–30.0)
Microalb, Ur: 0.8 mg/dL (ref 0.0–1.9)

## 2023-05-09 LAB — VITAMIN B12: Vitamin B-12: 300 pg/mL (ref 211–911)

## 2023-05-09 LAB — PSA: PSA: 0.42 ng/mL (ref 0.10–4.00)

## 2023-05-09 MED ORDER — EZETIMIBE 10 MG PO TABS
10.0000 mg | ORAL_TABLET | Freq: Every day | ORAL | 1 refills | Status: DC
Start: 2023-05-09 — End: 2023-10-15

## 2023-05-09 MED ORDER — DAPAGLIFLOZIN PROPANEDIOL 10 MG PO TABS: 10.0000 mg | ORAL_TABLET | Freq: Every day | ORAL | 1 refills | Status: DC

## 2023-05-09 MED ORDER — TRULICITY 1.5 MG/0.5ML ~~LOC~~ SOAJ: 1.5000 mg | SUBCUTANEOUS | 2 refills | Status: DC

## 2023-05-09 MED ORDER — OLMESARTAN MEDOXOMIL-HCTZ 40-25 MG PO TABS: 1.0000 | ORAL_TABLET | Freq: Every day | ORAL | 1 refills | Status: DC
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
## 2023-05-10 ENCOUNTER — Other Ambulatory Visit: Payer: Self-pay | Admitting: Internal Medicine

## 2023-05-10 DIAGNOSIS — E785 Hyperlipidemia, unspecified: Secondary | ICD-10-CM

## 2023-05-10 LAB — HEPATITIS C ANTIBODY: Hepatitis C Ab: NONREACTIVE

## 2023-05-10 MED ORDER — ROSUVASTATIN CALCIUM 40 MG PO TABS: 40.0000 mg | ORAL_TABLET | Freq: Every day | ORAL | 1 refills | Status: DC

## 2023-05-28 ENCOUNTER — Ambulatory Visit: Payer: Self-pay | Admitting: Surgery

## 2023-07-11 ENCOUNTER — Encounter (INDEPENDENT_AMBULATORY_CARE_PROVIDER_SITE_OTHER): Payer: Self-pay

## 2023-07-20 ENCOUNTER — Ambulatory Visit: Payer: Commercial Managed Care - PPO | Admitting: Surgery

## 2023-08-09 ENCOUNTER — Ambulatory Visit: Payer: Commercial Managed Care - PPO | Admitting: Internal Medicine

## 2023-08-17 ENCOUNTER — Ambulatory Visit: Payer: Commercial Managed Care - PPO | Admitting: Surgery

## 2023-08-24 ENCOUNTER — Other Ambulatory Visit: Payer: Self-pay | Admitting: Internal Medicine

## 2023-08-24 DIAGNOSIS — E1169 Type 2 diabetes mellitus with other specified complication: Secondary | ICD-10-CM

## 2023-09-10 ENCOUNTER — Ambulatory Visit: Payer: Commercial Managed Care - PPO | Admitting: Internal Medicine

## 2023-09-10 DIAGNOSIS — E1169 Type 2 diabetes mellitus with other specified complication: Secondary | ICD-10-CM

## 2023-09-10 DIAGNOSIS — E785 Hyperlipidemia, unspecified: Secondary | ICD-10-CM

## 2023-10-15 ENCOUNTER — Other Ambulatory Visit: Payer: Self-pay | Admitting: Physician Assistant

## 2023-10-15 ENCOUNTER — Emergency Department (HOSPITAL_COMMUNITY): Payer: Commercial Managed Care - PPO

## 2023-10-15 ENCOUNTER — Other Ambulatory Visit: Payer: Self-pay

## 2023-10-15 ENCOUNTER — Telehealth: Payer: Self-pay | Admitting: Physician Assistant

## 2023-10-15 ENCOUNTER — Inpatient Hospital Stay (HOSPITAL_COMMUNITY)
Admission: EM | Admit: 2023-10-15 | Discharge: 2023-10-21 | DRG: 445 | Disposition: A | Discharge status: Home / Self Care | Payer: Commercial Managed Care - PPO | Attending: Family Medicine | Admitting: Family Medicine

## 2023-10-15 ENCOUNTER — Encounter (HOSPITAL_COMMUNITY): Payer: Self-pay | Admitting: Physician Assistant

## 2023-10-15 DIAGNOSIS — K8050 Calculus of bile duct without cholangitis or cholecystitis without obstruction: Secondary | ICD-10-CM

## 2023-10-15 DIAGNOSIS — Z7982 Long term (current) use of aspirin: Secondary | ICD-10-CM | POA: Diagnosis not present

## 2023-10-15 DIAGNOSIS — I10 Essential (primary) hypertension: Secondary | ICD-10-CM | POA: Diagnosis present

## 2023-10-15 DIAGNOSIS — Z800 Family history of malignant neoplasm of digestive organs: Secondary | ICD-10-CM

## 2023-10-15 DIAGNOSIS — Z881 Allergy status to other antibiotic agents status: Secondary | ICD-10-CM

## 2023-10-15 DIAGNOSIS — R7989 Other specified abnormal findings of blood chemistry: Secondary | ICD-10-CM | POA: Diagnosis not present

## 2023-10-15 DIAGNOSIS — F419 Anxiety disorder, unspecified: Secondary | ICD-10-CM | POA: Diagnosis present

## 2023-10-15 DIAGNOSIS — E119 Type 2 diabetes mellitus without complications: Secondary | ICD-10-CM | POA: Diagnosis not present

## 2023-10-15 DIAGNOSIS — Z79899 Other long term (current) drug therapy: Secondary | ICD-10-CM | POA: Diagnosis not present

## 2023-10-15 DIAGNOSIS — R0789 Other chest pain: Secondary | ICD-10-CM | POA: Diagnosis present

## 2023-10-15 DIAGNOSIS — E871 Hypo-osmolality and hyponatremia: Secondary | ICD-10-CM | POA: Diagnosis present

## 2023-10-15 DIAGNOSIS — K7030 Alcoholic cirrhosis of liver without ascites: Secondary | ICD-10-CM | POA: Diagnosis present

## 2023-10-15 DIAGNOSIS — K429 Umbilical hernia without obstruction or gangrene: Secondary | ICD-10-CM | POA: Diagnosis present

## 2023-10-15 DIAGNOSIS — Z794 Long term (current) use of insulin: Secondary | ICD-10-CM | POA: Diagnosis not present

## 2023-10-15 DIAGNOSIS — R17 Unspecified jaundice: Secondary | ICD-10-CM | POA: Diagnosis present

## 2023-10-15 DIAGNOSIS — K828 Other specified diseases of gallbladder: Secondary | ICD-10-CM | POA: Diagnosis present

## 2023-10-15 DIAGNOSIS — K831 Obstruction of bile duct: Secondary | ICD-10-CM | POA: Diagnosis not present

## 2023-10-15 DIAGNOSIS — K219 Gastro-esophageal reflux disease without esophagitis: Secondary | ICD-10-CM | POA: Diagnosis not present

## 2023-10-15 DIAGNOSIS — I2510 Atherosclerotic heart disease of native coronary artery without angina pectoris: Secondary | ICD-10-CM | POA: Diagnosis present

## 2023-10-15 DIAGNOSIS — Z833 Family history of diabetes mellitus: Secondary | ICD-10-CM | POA: Diagnosis not present

## 2023-10-15 DIAGNOSIS — E1165 Type 2 diabetes mellitus with hyperglycemia: Secondary | ICD-10-CM | POA: Diagnosis present

## 2023-10-15 DIAGNOSIS — Z7984 Long term (current) use of oral hypoglycemic drugs: Secondary | ICD-10-CM | POA: Diagnosis not present

## 2023-10-15 DIAGNOSIS — D696 Thrombocytopenia, unspecified: Secondary | ICD-10-CM | POA: Diagnosis present

## 2023-10-15 DIAGNOSIS — E876 Hypokalemia: Secondary | ICD-10-CM | POA: Diagnosis present

## 2023-10-15 DIAGNOSIS — F32A Depression, unspecified: Secondary | ICD-10-CM | POA: Diagnosis present

## 2023-10-15 DIAGNOSIS — K8309 Other cholangitis: Secondary | ICD-10-CM | POA: Diagnosis not present

## 2023-10-15 DIAGNOSIS — Z8249 Family history of ischemic heart disease and other diseases of the circulatory system: Secondary | ICD-10-CM

## 2023-10-15 DIAGNOSIS — K8061 Calculus of gallbladder and bile duct with cholecystitis, unspecified, with obstruction: Principal | ICD-10-CM | POA: Diagnosis present

## 2023-10-15 DIAGNOSIS — E785 Hyperlipidemia, unspecified: Secondary | ICD-10-CM | POA: Diagnosis present

## 2023-10-15 DIAGNOSIS — R072 Precordial pain: Secondary | ICD-10-CM

## 2023-10-15 DIAGNOSIS — K8020 Calculus of gallbladder without cholecystitis without obstruction: Secondary | ICD-10-CM | POA: Diagnosis not present

## 2023-10-15 DIAGNOSIS — Z7985 Long-term (current) use of injectable non-insulin antidiabetic drugs: Secondary | ICD-10-CM | POA: Diagnosis not present

## 2023-10-15 DIAGNOSIS — I452 Bifascicular block: Secondary | ICD-10-CM | POA: Diagnosis present

## 2023-10-15 DIAGNOSIS — E8770 Fluid overload, unspecified: Secondary | ICD-10-CM | POA: Diagnosis present

## 2023-10-15 DIAGNOSIS — F1010 Alcohol abuse, uncomplicated: Secondary | ICD-10-CM | POA: Diagnosis present

## 2023-10-15 DIAGNOSIS — R0602 Shortness of breath: Secondary | ICD-10-CM

## 2023-10-15 DIAGNOSIS — Z808 Family history of malignant neoplasm of other organs or systems: Secondary | ICD-10-CM | POA: Diagnosis not present

## 2023-10-15 DIAGNOSIS — R079 Chest pain, unspecified: Secondary | ICD-10-CM | POA: Diagnosis not present

## 2023-10-15 HISTORY — DX: Atherosclerotic heart disease of native coronary artery without angina pectoris: I25.10

## 2023-10-15 LAB — BASIC METABOLIC PANEL
Anion gap: 11 (ref 5–15)
BUN: 10 mg/dL (ref 6–20)
CO2: 22 mmol/L (ref 22–32)
Calcium: 8.8 mg/dL — ABNORMAL LOW (ref 8.9–10.3)
Chloride: 98 mmol/L (ref 98–111)
Creatinine, Ser: 0.79 mg/dL (ref 0.61–1.24)
GFR, Estimated: >60 mL/min (ref >60)
Glucose, Bld: 311 mg/dL — ABNORMAL HIGH (ref 70–99)
Potassium: 4.1 mmol/L (ref 3.5–5.1)
Sodium: 131 mmol/L — ABNORMAL LOW (ref 135–145)

## 2023-10-15 LAB — LIPASE, BLOOD: Lipase: 30 U/L (ref 11–51)

## 2023-10-15 LAB — HEPATITIS PANEL, ACUTE
HCV Ab: NONREACTIVE
Hep A IgM: NONREACTIVE
Hep B C IgM: NONREACTIVE
Hepatitis B Surface Ag: NONREACTIVE

## 2023-10-15 LAB — CBC
HCT: 44.6 % (ref 39.0–52.0)
Hemoglobin: 15.4 g/dL (ref 13.0–17.0)
MCH: 30.7 pg (ref 26.0–34.0)
MCHC: 34.5 g/dL (ref 30.0–36.0)
MCV: 88.8 fL (ref 80.0–100.0)
Platelets: 118 10*3/uL — ABNORMAL LOW (ref 150–400)
RBC: 5.02 MIL/uL (ref 4.22–5.81)
RDW: 12.8 % (ref 11.5–15.5)
WBC: 8.7 10*3/uL (ref 4.0–10.5)
nRBC: 0 % (ref 0.0–0.2)

## 2023-10-15 LAB — URINALYSIS, ROUTINE W REFLEX MICROSCOPIC
Glucose, UA: >=500 mg/dL — AB
Hgb urine dipstick: NEGATIVE
Ketones, ur: 5 mg/dL — AB
Leukocytes,Ua: NEGATIVE
Nitrite: NEGATIVE
Protein, ur: 30 mg/dL — AB
Specific Gravity, Urine: 1.032 — ABNORMAL HIGH (ref 1.005–1.030)
pH: 6 (ref 5.0–8.0)

## 2023-10-15 LAB — HEPATIC FUNCTION PANEL
ALT: 166 U/L — ABNORMAL HIGH (ref 0–44)
AST: 123 U/L — ABNORMAL HIGH (ref 15–41)
Albumin: 2.8 g/dL — ABNORMAL LOW (ref 3.5–5.0)
Alkaline Phosphatase: 141 U/L — ABNORMAL HIGH (ref 38–126)
Bilirubin, Direct: 6.3 mg/dL — ABNORMAL HIGH (ref 0.0–0.2)
Indirect Bilirubin: 3.8 mg/dL — ABNORMAL HIGH (ref 0.3–0.9)
Total Bilirubin: 10.1 mg/dL — ABNORMAL HIGH (ref <1.2)
Total Protein: 6.8 g/dL (ref 6.5–8.1)

## 2023-10-15 LAB — C-REACTIVE PROTEIN: CRP: 7.9 mg/dL — ABNORMAL HIGH (ref <1.0)

## 2023-10-15 LAB — TROPONIN I (HIGH SENSITIVITY)
Troponin I (High Sensitivity): 7 ng/L (ref <18)
Troponin I (High Sensitivity): 7 ng/L (ref <18)

## 2023-10-15 LAB — SEDIMENTATION RATE: Sed Rate: 48 mm/h — ABNORMAL HIGH (ref 0–16)

## 2023-10-15 LAB — SARS CORONAVIRUS 2 BY RT PCR: SARS Coronavirus 2 by RT PCR: NEGATIVE

## 2023-10-15 LAB — GLUCOSE, CAPILLARY: Glucose-Capillary: 244 mg/dL — ABNORMAL HIGH (ref 70–99)

## 2023-10-15 MED ORDER — SODIUM CHLORIDE 0.9 % IV SOLN: INTRAVENOUS | Status: AC

## 2023-10-15 MED ORDER — PANTOPRAZOLE SODIUM 40 MG IV SOLR
40.0000 mg | Freq: Once | INTRAVENOUS | Status: DC
  Filled 2023-10-15: qty 10

## 2023-10-15 MED ORDER — PANTOPRAZOLE SODIUM 40 MG PO TBEC: 40.0000 mg | DELAYED_RELEASE_TABLET | Freq: Every day | ORAL | 1 refills | Status: DC

## 2023-10-15 MED ORDER — NITROGLYCERIN 0.4 MG SL SUBL
0.4000 mg | SUBLINGUAL_TABLET | Freq: Once | SUBLINGUAL | Status: AC
  Administered 2023-10-15: 0.4 mg via SUBLINGUAL
  Filled 2023-10-15: qty 1

## 2023-10-15 MED ORDER — PANTOPRAZOLE SODIUM 40 MG PO TBEC
40.0000 mg | DELAYED_RELEASE_TABLET | Freq: Once | ORAL | Status: AC
  Administered 2023-10-15: 40 mg via ORAL
  Filled 2023-10-15: qty 1

## 2023-10-15 MED ORDER — SUCRALFATE 1 GM/10ML PO SUSP
1.0000 g | Freq: Three times a day (TID) | ORAL | 0 refills | Status: DC
Start: 2023-10-15 — End: 2024-08-20

## 2023-10-15 MED ORDER — ASPIRIN 81 MG PO TBEC
81.0000 mg | DELAYED_RELEASE_TABLET | Freq: Every day | ORAL | Status: DC
  Administered 2023-10-15 – 2023-10-21 (×7): 81 mg via ORAL
  Filled 2023-10-15 (×7): qty 1

## 2023-10-15 MED ORDER — THIAMINE MONONITRATE 100 MG PO TABS
100.0000 mg | ORAL_TABLET | Freq: Every day | ORAL | Status: DC
  Administered 2023-10-16 – 2023-10-21 (×6): 100 mg via ORAL
  Filled 2023-10-15 (×6): qty 1

## 2023-10-15 MED ORDER — MAGNESIUM HYDROXIDE 400 MG/5ML PO SUSP
30.0000 mL | Freq: Every day | ORAL | Status: DC | PRN
  Administered 2023-10-17: 30 mL via ORAL
  Filled 2023-10-15 (×2): qty 30

## 2023-10-15 MED ORDER — FOLIC ACID 1 MG PO TABS
1.0000 mg | ORAL_TABLET | Freq: Every day | ORAL | Status: DC
  Administered 2023-10-16 – 2023-10-21 (×6): 1 mg via ORAL
  Filled 2023-10-15 (×6): qty 1

## 2023-10-15 MED ORDER — ADULT MULTIVITAMIN W/MINERALS CH
1.0000 | ORAL_TABLET | Freq: Every day | ORAL | Status: DC
  Administered 2023-10-16 – 2023-10-21 (×6): 1 via ORAL
  Filled 2023-10-15 (×6): qty 1

## 2023-10-15 MED ORDER — ALUM & MAG HYDROXIDE-SIMETH 200-200-20 MG/5ML PO SUSP
30.0000 mL | Freq: Once | ORAL | Status: AC
  Administered 2023-10-15: 30 mL via ORAL
  Filled 2023-10-15: qty 30

## 2023-10-15 MED ORDER — ONDANSETRON HCL 4 MG PO TABS: 4.0000 mg | ORAL_TABLET | Freq: Four times a day (QID) | ORAL | Status: DC | PRN

## 2023-10-15 MED ORDER — ONDANSETRON HCL 4 MG/2ML IJ SOLN
4.0000 mg | Freq: Four times a day (QID) | INTRAMUSCULAR | Status: DC | PRN
  Administered 2023-10-19: 4 mg via INTRAVENOUS

## 2023-10-15 NOTE — ED Notes (Signed)
EDP at bedside for update 

## 2023-10-15 NOTE — ED Notes (Signed)
Pt given turkey sandwich & water per request 
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
## 2023-10-15 NOTE — Progress Notes (Signed)
New Admission Note:   Arrival Method: Arrived from [facility] ED via stretcher Mental Orientation: Alert and oriented x4 Telemetry: Box #4 Assessment: Completed Skin: Intact IV: Rt FA Pain: 0/10 Tubes: N/A Safety Measures: Safety Fall Prevention Plan has been discussed.  Admission: Completed Orientation: Patient has been oriented to the room, unit and staff.  Family: Wife at bedside  Orders have been reviewed and implemented. Will continue to monitor the patient. Call light has been placed within reach and bed alarm has been activated.   Raigan Baria [employer], RN-BC Phone number: [phone]

## 2023-10-15 NOTE — ED Triage Notes (Signed)
Patient arrives from home for eval of central/L sided chest pain since Saturday. Reports constant since onset and had sob on Saturday but none today. Some associated nausea.

## 2023-10-15 NOTE — Telephone Encounter (Addendum)
Hi triage, this is a patient Dr. Jens Som saw in the ED for chest pain/dyspnea. He would like the patient to undergo outpatient coronary CT scan. Can you help place outpatient order and notify the patient of his instructions? Dx chest pain or precordial pain, normal troponins. He is likely going to be discharged later today if GI workup OK in ER. I already outlined his instructions for echo and follow-up with Joni Reining on his AVS. Thank you so much!  ADDENDUM: See next message about cancelling request for coronary CT - LFTs were markedly elevated, suspect GI source of pain. Dr. Jens Som feels we can hold off on coronary CTA but will continue plan for outpatient echo/appt.

## 2023-10-15 NOTE — Discharge Instructions (Signed)
Your workup here was reassuring.  You were evaluated by cardiology team and they did not feel that your chest pain was primarily cardiac in nature.  They would like to follow-up with you outpatient for further testing.  He had yellowing of the eye.  Blood work here did not show any concern.  I have listed a gastroenterologist for you to follow-up with.  Have sent in a couple medications to see if that would help your chest pain.  These are for acid reflux.

## 2023-10-15 NOTE — ED Provider Notes (Signed)
[place] EMERGENCY DEPARTMENT AT [facility] Provider Note   CSN: [id number] Arrival date & time: 10/15/23  2423     History  Chief Complaint  Patient presents with   Chest Pain    Dylan Blake is a 62 y.o. male.  62 year old male presents today for concern of chest pain.  This started 3 days ago after eating a meal.  He initially thought this was acid reflux.  He took antacid without any improvement.  States that is substernal and left-sided.  States there is some radiation to the left shoulder and he had shortness of breath on Saturday.  No shortness of breath since Saturday.  Endorses some nausea but no vomiting.  Denies any significant history of CAD but in 2020 he did have an admission for chest pain.  Has not seen cardiologist since.  Reports significant family history of heart disease.  He states he has periods of constant chest pain which is pressure-like however intermittently he will have sharp chest pain.  This is not positional or pleuritic.  The history is provided by the patient. No language interpreter was used.       Home Medications Prior to Admission medications   Medication Sig Start Date End Date Taking? Authorizing Provider  ezetimibe (ZETIA) 10 MG tablet Take 1 tablet (10 mg total) by mouth daily. 05/09/23   Philip Aspen, Limmie Patricia, MD  aspirin EC 81 MG tablet Take 1 tablet (81 mg total) by mouth daily. 12/24/18   Philip Aspen, Limmie Patricia, MD  dapagliflozin propanediol (FARXIGA) 10 MG TABS tablet Take 1 tablet (10 mg total) by mouth daily. 05/09/23   Philip Aspen, Limmie Patricia, MD  Dulaglutide (TRULICITY) 1.5 MG/0.5ML SOPN INJECT 1.5 MG INTO THE SKIN ONCE A WEEK. 08/27/23   Philip Aspen, Limmie Patricia, MD  ibuprofen (ADVIL,MOTRIN) 200 MG tablet Take 800 mg by mouth every 6 (six) hours as needed for pain.    [provider]  metFORMIN (GLUCOPHAGE) 1000 MG tablet TAKE 1 TABLET BY MOUTH TWICE A DAY WITH FOOD 03/28/23   Philip Aspen, Limmie Patricia, MD  Multiple Vitamin (MULTIVITAMIN) tablet Take 1 tablet by mouth daily.    [provider]  olmesartan-hydrochlorothiazide (BENICAR HCT) 40-25 MG tablet Take 1 tablet by mouth daily. 05/09/23   Philip Aspen, Limmie Patricia, MD  rosuvastatin (CRESTOR) 40 MG tablet Take 1 tablet (40 mg total) by mouth daily. 05/10/23   Philip Aspen, Limmie Patricia, MD      Allergies    Patient has no known allergies.    Review of Systems   Review of Systems  Constitutional:  Negative for chills and fever.  Respiratory:  Positive for shortness of breath.   Cardiovascular:  Positive for chest pain.  Gastrointestinal:  Positive for nausea. Negative for vomiting.  Genitourinary:  Negative for flank pain.  Neurological:  Negative for light-headedness.  All other systems reviewed and are negative.   Physical Exam Updated Vital Signs BP (!) 149/75   Pulse 84   Temp 98.2 F (36.8 C) (Oral)   Resp 16   Ht 6' (1.829 m)   Wt 81.6 kg   SpO2 100%   BMI 24.41 kg/m  Physical Exam Vitals and nursing note reviewed.  Constitutional:      General: He is not in acute distress.    Appearance: Normal appearance. He is not ill-appearing.  HENT:     Head: Normocephalic and atraumatic.     Nose: Nose normal.  Eyes:  Conjunctiva/sclera: Conjunctivae normal.  Cardiovascular:     Rate and Rhythm: Normal rate and regular rhythm.  Pulmonary:     Effort: Pulmonary effort is normal. No respiratory distress.  Abdominal:     General: There is no distension.     Palpations: Abdomen is soft.     Tenderness: There is no abdominal tenderness. There is no guarding.  Musculoskeletal:        General: No deformity. Normal range of motion.  Skin:    Findings: No rash.  Neurological:     Mental Status: He is alert.     ED Results / Procedures / Treatments   Labs (all labs ordered are listed, but only abnormal results are displayed) Labs Reviewed  BASIC METABOLIC PANEL - Abnormal; Notable for the following  components:      Result Value   Sodium 131 (*)    Glucose, Bld 311 (*)    Calcium 8.8 (*)    All other components within normal limits  CBC - Abnormal; Notable for the following components:   Platelets 118 (*)    All other components within normal limits  TROPONIN I (HIGH SENSITIVITY)  TROPONIN I (HIGH SENSITIVITY)    EKG None  Radiology DG Chest 2 View  Result Date: 10/15/2023 CLINICAL DATA:  CP. EXAM: CHEST - 2 VIEW COMPARISON:  07/06/2019. FINDINGS: Bilateral lung fields are clear. Bilateral costophrenic angles are clear. Normal cardio-mediastinal silhouette. No acute osseous abnormalities. The soft tissues are within normal limits. IMPRESSION: *No active cardiopulmonary disease. Electronically Signed   By: Jules Schick M.D.   On: 10/15/2023 09:08    Procedures Procedures    Medications Ordered in ED Medications  nitroGLYCERIN (NITROSTAT) SL tablet 0.4 mg (0.4 mg Sublingual Given 10/15/23 1032)    ED Course/ Medical Decision Making/ A&P Clinical Course as of 10/15/23 1528  Mon Oct 15, 2023  1503 CP for 3 days. Cleared for dc from cardio. Noticed scleral icterus. Awaiting hepatic function panel. If abnormal, will need RUQ [place]. If normal, can dc cardiac and GI follow up.  [JR]    Clinical Course User Index [JR] Gareth Eagle, PA-C                                 Medical Decision Making Amount and/or Complexity of Data Reviewed Labs: ordered. Radiology: ordered.  Risk OTC drugs. Prescription drug management.   Medical Decision Making / ED Course   This patient presents to the ED for concern of chest pain, this involves an extensive number of treatment options, and is a complaint that carries with it a high risk of complications and morbidity.  The differential diagnosis includes ACS PE, pneumonia, MSK etiology, pericarditis, myocarditis  MDM: 62 year old male presents today for chest pain.  Past medical history as noted in the HPI.  ACS workup obtained.  EKG  initially concerning for pericarditis however his clinical story does not fit.  Moderate heart score.  CBC is unremarkable.  BMP with sodium of 131, and glucose of 311 otherwise without acute concern.  Troponin initially not significantly elevated and flat.  Evaluated by cardiology and do not feel this is primary cardiac chest pain and would like outpatient follow-up.  He was noted to have scleral icterus.  Hepatic function panel ordered.  Pending at the time of shift change.  Signed out to oncoming provider.  If hepatic function panel is normal he is appropriate to follow-up  outpatient with GI and cardiology.  If this is abnormal he would require right upper quadrant ultrasound prior to discharge for further evaluation.  I was notified by nurse that patient had orange color urine and he is concerned.  Will place UA.  Lab Tests: -I ordered, reviewed, and interpreted labs.   The pertinent results include:   Labs Reviewed  BASIC METABOLIC PANEL - Abnormal; Notable for the following components:      Result Value   Sodium 131 (*)    Glucose, Bld 311 (*)    Calcium 8.8 (*)    All other components within normal limits  CBC - Abnormal; Notable for the following components:   Platelets 118 (*)    All other components within normal limits  TROPONIN I (HIGH SENSITIVITY)  TROPONIN I (HIGH SENSITIVITY)      EKG  EKG Interpretation Date/Time:    Ventricular Rate:    PR Interval:    QRS Duration:    QT Interval:    QTC Calculation:   R Axis:      Text Interpretation:           Imaging Studies ordered: I ordered imaging studies including chest x-ray I independently visualized and interpreted imaging. I agree with the radiologist interpretation   Medicines ordered and prescription drug management: Meds ordered this encounter  Medications   nitroGLYCERIN (NITROSTAT) SL tablet 0.4 mg    -I have reviewed the patients home medicines and have made adjustments as needed   Cardiac  Monitoring: The patient was maintained on a cardiac monitor.  I personally viewed and interpreted the cardiac monitored which showed an underlying rhythm of: NSR  Co morbidities that complicate the patient evaluation  Past Medical History:  Diagnosis Date   Anxiety    Diverticulosis    Essential hypertension    Hyperlipidemia    Seasonal allergies    Type 2 diabetes mellitus (HCC)       Dispostion: Signout to oncoming provider to follow-up on hepatic function panel and UA.  If these are normal he can follow-up with gastroenterology and cardiology outpatient.  Patient is agreeable with this plan as well as his family.  Final Clinical Impression(s) / ED Diagnoses Final diagnoses:  Atypical chest pain  Scleral icterus    Rx / DC Orders ED Discharge Orders          Ordered    pantoprazole (PROTONIX) 40 MG tablet  Daily        10/15/23 1527    sucralfate (CARAFATE) 1 GM/10ML suspension  3 times daily with meals & bedtime        10/15/23 1527              Marita Kansas, PA-C 10/15/23 1530    Ernie Avena, MD 10/18/23 1146

## 2023-10-15 NOTE — Telephone Encounter (Signed)
Update to triage - patient was found to have abnormal liver function in ER, which may explain his chest pain. Dr. Jens Som recommends we hold off on coronary CTA. Disregard previous message regarding arranging outpatient coronary CTA. We can still plan to keep the echocardiogram as outpatient that I arranged. No need to call patient.

## 2023-10-15 NOTE — ED Provider Notes (Signed)
Accepted handoff at shift change from [facility]. Please see prior provider note for more detail.   Briefly: Patient is 62 y.o. initially presenting for chest pain.  Cardiology workup was unremarkable.  Noted scleral icterus on exam.  LFTs ordered.  No abdominal tenderness.  Patient states that has been a "heavy alcohol drinker for many years".  DDX: concern for acute hepatitis, cirrhosis of the liver, acute cholecystitis, biliary colic, other.  Plan: Follow-up on right upper quadrant ultrasound and LFTs.   Physical Exam   Vitals:   10/15/23 1608 10/15/23 1940  BP: (!) 151/77 (!) 143/71  Pulse: 87 87  Resp: 16 16  Temp: 98.4 F (36.9 C) 99.6 F (37.6 C)  SpO2: 100% 100%    CONSTITUTIONAL:  well-appearing, NAD NEURO:  Alert and oriented x 3, CN 3-12 grossly intact EYES:  eyes equal and reactive, bilateral scleral icterus noted ENT/NECK:  Supple, no stridor  CARDIO: regular rate and rhythm, appears well-perfused  PULM:  No respiratory distress, CTAB GI/GU:  non-distended, soft, non tender MSK/SPINE:  No gross deformities, no edema, moves all extremities  SKIN: Jaundice, no rash, atraumatic   *Additional and/or pertinent findings included in MDM below     Procedures  Procedures  ED Course / MDM   Clinical Course as of 10/15/23 2045  Mon Oct 15, 2023  1503 CP for 3 days. Cleared for dc from cardio. Noticed scleral icterus. Awaiting hepatic function panel. If abnormal, will need RUQ [place]. If normal, can dc cardiac and GI follow up.  [JR]    Clinical Course User Index [JR] Gareth Eagle, PA-C   Medical Decision Making Amount and/or Complexity of Data Reviewed Labs: ordered. Radiology: ordered.  Risk OTC drugs. Prescription drug management. Decision regarding hospitalization.    LFTs notably elevated, total bili was 10.  Right upper quadrant ultrasound concerning for cirrhosis.  On reassessment patient remained without abdominal pain and was well-appearing,  hemodynamically stable and nontoxic.  Discussed patient with Dr. Russella Dar of gastroenterology.  Advised to admit for decompensated cirrhosis.  Stated that he could eat and drink and that GI would evaluate him tomorrow morning.  Admitted to hospital service with Dr. Buena Irish.      Gareth Eagle, PA-C 10/15/23 2052    Durwin Glaze, MD 10/16/23 [phone]

## 2023-10-15 NOTE — ED Notes (Signed)
Patient transported to Ultrasound 

## 2023-10-15 NOTE — ED Notes (Signed)
Patient transported to X-ray 

## 2023-10-15 NOTE — ED Notes (Signed)
Cardiology at bedside.
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
## 2023-10-15 NOTE — Progress Notes (Addendum)
Echo scheduled 11/18 + follow-up APP appt scheduled 12/3, appointments and location added to AVS. Notified ED of LFTs, needs further GI eval of this. Per Dr. Jens Som, hold off coronary CTA given suspected GI source of pain. Will still plan to keep the outpatient echocardiogram and cardiology follow-up as planned. Patient does not have ER phone in hallway so asked ED PA to relay update in plan to patient. Please notify cardiology if we can be of further assistance.

## 2023-10-15 NOTE — ED Notes (Signed)
Cards at bedside

## 2023-10-16 DIAGNOSIS — R0789 Other chest pain: Secondary | ICD-10-CM | POA: Diagnosis not present

## 2023-10-16 DIAGNOSIS — K219 Gastro-esophageal reflux disease without esophagitis: Secondary | ICD-10-CM | POA: Diagnosis not present

## 2023-10-16 DIAGNOSIS — K7030 Alcoholic cirrhosis of liver without ascites: Secondary | ICD-10-CM | POA: Diagnosis not present

## 2023-10-16 DIAGNOSIS — R7989 Other specified abnormal findings of blood chemistry: Secondary | ICD-10-CM

## 2023-10-16 LAB — COMPREHENSIVE METABOLIC PANEL
ALT: 131 U/L — ABNORMAL HIGH (ref 0–44)
AST: 88 U/L — ABNORMAL HIGH (ref 15–41)
Albumin: 2.6 g/dL — ABNORMAL LOW (ref 3.5–5.0)
Alkaline Phosphatase: 141 U/L — ABNORMAL HIGH (ref 38–126)
Anion gap: 6 (ref 5–15)
BUN: 13 mg/dL (ref 6–20)
CO2: 23 mmol/L (ref 22–32)
Calcium: 8 mg/dL — ABNORMAL LOW (ref 8.9–10.3)
Chloride: 104 mmol/L (ref 98–111)
Creatinine, Ser: 0.39 mg/dL — ABNORMAL LOW (ref 0.61–1.24)
GFR, Estimated: >60 mL/min (ref >60)
Glucose, Bld: 208 mg/dL — ABNORMAL HIGH (ref 70–99)
Potassium: 3.5 mmol/L (ref 3.5–5.1)
Sodium: 133 mmol/L — ABNORMAL LOW (ref 135–145)
Total Bilirubin: 13.5 mg/dL — ABNORMAL HIGH (ref <1.2)
Total Protein: 6.4 g/dL — ABNORMAL LOW (ref 6.5–8.1)

## 2023-10-16 LAB — GLUCOSE, CAPILLARY
Glucose-Capillary: 234 mg/dL — ABNORMAL HIGH (ref 70–99)
Glucose-Capillary: 205 mg/dL — ABNORMAL HIGH (ref 70–99)
Glucose-Capillary: 219 mg/dL — ABNORMAL HIGH (ref 70–99)
Glucose-Capillary: 244 mg/dL — ABNORMAL HIGH (ref 70–99)

## 2023-10-16 LAB — HEMOGLOBIN A1C
Hgb A1c MFr Bld: 7.8 % — ABNORMAL HIGH (ref 4.8–5.6)
Mean Plasma Glucose: 177.16 mg/dL

## 2023-10-16 LAB — PROTIME-INR
INR: 1.3 — ABNORMAL HIGH (ref 0.8–1.2)
Prothrombin Time: 16.2 s — ABNORMAL HIGH (ref 11.4–15.2)

## 2023-10-16 LAB — TSH: TSH: 0.781 u[IU]/mL (ref 0.350–4.500)

## 2023-10-16 LAB — IRON AND TIBC
Iron: 89 ug/dL (ref 45–182)
Saturation Ratios: 37 % (ref 17.9–39.5)
TIBC: 242 ug/dL — ABNORMAL LOW (ref 250–450)
UIBC: 153 ug/dL

## 2023-10-16 LAB — HIV ANTIBODY (ROUTINE TESTING W REFLEX): HIV Screen 4th Generation wRfx: NONREACTIVE

## 2023-10-16 LAB — FERRITIN: Ferritin: 477 ng/mL — ABNORMAL HIGH (ref 24–336)

## 2023-10-16 LAB — MAGNESIUM: Magnesium: 1.8 mg/dL (ref 1.7–2.4)

## 2023-10-16 MED ORDER — OXYCODONE HCL 5 MG PO TABS
5.0000 mg | ORAL_TABLET | Freq: Four times a day (QID) | ORAL | Status: DC | PRN
  Administered 2023-10-16: 5 mg via ORAL
  Filled 2023-10-16: qty 1

## 2023-10-16 MED ORDER — INSULIN ASPART 100 UNIT/ML IJ SOLN
0.0000 [IU] | Freq: Three times a day (TID) | INTRAMUSCULAR | Status: DC
  Administered 2023-10-16 (×2): 3 [IU] via SUBCUTANEOUS
  Administered 2023-10-17: 5 [IU] via SUBCUTANEOUS
  Administered 2023-10-17 (×2): 2 [IU] via SUBCUTANEOUS

## 2023-10-16 MED ORDER — INSULIN ASPART 100 UNIT/ML IJ SOLN
0.0000 [IU] | Freq: Every day | INTRAMUSCULAR | Status: DC
  Administered 2023-10-16 – 2023-10-17 (×2): 2 [IU] via SUBCUTANEOUS
  Administered 2023-10-18: 4 [IU] via SUBCUTANEOUS

## 2023-10-16 NOTE — Consult Note (Addendum)
Consultation  Referring Provider:  [facility]  Primary Care Physician:  Philip Aspen, Limmie Patricia, MD Primary Gastroenterologist:  Dr. Adela Lank       Reason for Consultation:    Elevated LFTs  LOS: 1 day          HPI:   Dylan Blake is a 62 y.o. male with past medical history significant for type 2 diabetes, hyperlipidemia, presents for evaluation of elevated LFTs.  Patient originally presented to ED with mid chest pressure feeling like terrible heartburn without relief from over-the-counter medications ongoing x 3 days.  He was seen by cardiology in the ED.  Negative troponins.  Patient's atypical chest pain was resolved with nitroglycerin in the ED.  Cardiology felt it was not cardiac in nature.  Patient was found to have a T. bili of 10 on arrival and GI was consulted for elevated LFTs.  No previous history of elevated LFTs.  Workup notable for T. bili 13.5 AST 88/ALT 131/alk phos 141 Albumin 2.6 CRP 7.9 Negative hepatitis panel Lipase 30 Platelets 118 RUQ ultrasound with possible cirrhosis.  No gallstones.  Portal vein is patent.  Patient states he has remote history of alcohol use in which he drank heavily in his youth including liquor and beer on a daily basis.  Over the last few years he drinks about 2 drinks per week (beer/liquor).  States he has not drank in over 2 weeks.  Reports gradual weight loss of about 100 pounds over the past few years.  Does have a history of diabetes and has had multiple changes in his diabetic medication with the most recent being Trulicity as his PCP has been encouraging him to try to lose weight.  Patient states he takes 2 to 3 tablets of ibuprofen daily for the last few years.  Denies GERD.  Denies dysphagia.  Denies family history of colon cancer.  States his brother died from alcoholic cirrhosis  PREVIOUS GI WORKUP   Colonoscopy 02/08/2018 for screening - Single colonic angiodysplastic lesion - One 4 mm tubular adenoma in transverse  colon - Diverticulosis in sigmoid colon - Internal hemorrhoids - Repeat 5 years (02/2023)  Past Medical History:  Diagnosis Date  . Anxiety   . Diverticulosis   . Essential hypertension   . Hyperlipidemia   . Nonobstructive atherosclerosis of coronary artery   . Seasonal allergies   . Type 2 diabetes mellitus (HCC)     Surgical History:  He  has a past surgical history that includes Knee surgery; Nasal reconstruction; Tonsillectomy; and Wisdom tooth extraction. Family History:  His family history includes Diabetes in his brother, father, and mother; Heart attack in an other family member; Heart disease in his brother and mother; Liver cancer in his brother; Skin cancer in his father and another family member. Social History:   reports that he has never smoked. He has never used smokeless tobacco. He reports current alcohol use. He reports that he does not use drugs.  Prior to Admission medications   Medication Sig Start Date End Date Taking? Authorizing Provider  aspirin EC 81 MG tablet Take 1 tablet (81 mg total) by mouth daily. 12/24/18  Yes Philip Aspen, Limmie Patricia, MD  dapagliflozin propanediol (FARXIGA) 10 MG TABS tablet Take 1 tablet (10 mg total) by mouth daily. 05/09/23  Yes Henderson Cloud, MD  Dulaglutide (TRULICITY) 1.5 MG/0.5ML SOPN INJECT 1.5 MG INTO THE SKIN ONCE A WEEK. Patient taking differently: Inject 1.5 mg into the  skin once a week. Monday 08/27/23  Yes Henderson Cloud, MD  ibuprofen (ADVIL,MOTRIN) 200 MG tablet Take 800 mg by mouth every 6 (six) hours as needed for pain.   Yes [provider]  metFORMIN (GLUCOPHAGE) 1000 MG tablet TAKE 1 TABLET BY MOUTH TWICE A DAY WITH FOOD 03/28/23  Yes Philip Aspen, Limmie Patricia, MD  Multiple Vitamin (MULTIVITAMIN) tablet Take 1 tablet by mouth daily.   Yes [provider]  pantoprazole (PROTONIX) 40 MG tablet Take 1 tablet (40 mg total) by mouth daily. 10/15/23  Yes Ali, Amjad, PA-C   rosuvastatin (CRESTOR) 40 MG tablet Take 1 tablet (40 mg total) by mouth daily. 05/10/23  Yes Philip Aspen, Limmie Patricia, MD  sucralfate (CARAFATE) 1 GM/10ML suspension Take 10 mLs (1 g total) by mouth 4 (four) times daily -  with meals and at bedtime. 10/15/23  Yes Karie Mainland, Amjad, PA-C    Current Facility-Administered Medications  Medication Dose Route Frequency Provider Last Rate Last Admin  . 0.9 %  sodium chloride infusion   Intravenous Continuous Buena Irish, MD 75 mL/hr at 10/15/23 2225 New Bag at 10/15/23 2225  . aspirin EC tablet 81 mg  81 mg Oral Daily Buena Irish, MD   81 mg at 10/15/23 2225  . folic acid (FOLVITE) tablet 1 mg  1 mg Oral Daily Buena Irish, MD      . magnesium hydroxide (MILK OF MAGNESIA) suspension 30 mL  30 mL Oral Daily PRN Buena Irish, MD      . multivitamin with minerals tablet 1 tablet  1 tablet Oral Daily Buena Irish, MD      . ondansetron [facility]) tablet 4 mg  4 mg Oral Q6H PRN Buena Irish, MD       Or  . ondansetron [facility]) injection 4 mg  4 mg Intravenous Q6H PRN Buena Irish, MD      . pantoprazole (PROTONIX) injection 40 mg  40 mg Intravenous Once Karie Mainland, Amjad, PA-C      . thiamine (VITAMIN B1) tablet 100 mg  100 mg Oral Daily Buena Irish, MD        Allergies as of 10/15/2023  . (No Known Allergies)    Review of Systems  Constitutional:  Positive for malaise/fatigue and weight loss. Negative for chills and fever.  HENT:  Negative for hearing loss and tinnitus.   Eyes:  Negative for blurred vision and double vision.  Respiratory:  Negative for cough and hemoptysis.   Cardiovascular:  Positive for chest pain. Negative for palpitations.  Gastrointestinal:  Negative for abdominal pain, blood in stool, constipation, diarrhea, heartburn, melena, nausea and vomiting.  Genitourinary:  Negative for dysuria and urgency.  Musculoskeletal:  Negative for myalgias and neck pain.  Skin:  Negative for itching and  rash.  Neurological:  Negative for loss of consciousness and weakness.  Psychiatric/Behavioral:  Negative for depression and suicidal ideas.        Physical Exam:  Vital signs in last 24 hours: Temp:  [98.2 F (36.8 C)-99.6 F (37.6 C)] 98.6 F (37 C) (11/05 0600) Pulse Rate:  [80-104] 80 (11/05 0600) Resp:  [13-20] 18 (11/05 0600) BP: (128-168)/(68-90) 138/82 (11/05 0600) SpO2:  [98 %-100 %] 99 % (11/05 0204) Weight:  [73.7 kg-81.6 kg] 73.7 kg (11/04 2152)   Last BM recorded by nurses in past 5 days No data recorded  Physical Exam Constitutional:      Appearance: He is ill-appearing.  HENT:     Head: Normocephalic and atraumatic.  Nose: Nose normal. No congestion.     Mouth/Throat:     Mouth: Mucous membranes are dry.  Eyes:     General: Scleral icterus present.     Extraocular Movements: Extraocular movements intact.  Cardiovascular:     Rate and Rhythm: Normal rate and regular rhythm.  Pulmonary:     Effort: Pulmonary effort is normal. No respiratory distress.  Abdominal:     General: Bowel sounds are normal. There is no distension.     Palpations: Abdomen is soft. There is no mass.     Tenderness: There is no abdominal tenderness. There is no guarding or rebound.     Hernia: No hernia is present.  Musculoskeletal:        General: No swelling. Normal range of motion.     Cervical back: Normal range of motion and neck supple.  Skin:    General: Skin is warm and dry.     Coloration: Skin is jaundiced.  Neurological:     General: No focal deficit present.     Mental Status: He is oriented to person, place, and time.  Psychiatric:        Mood and Affect: Mood normal.        Behavior: Behavior normal.        Thought Content: Thought content normal.        Judgment: Judgment normal.      LAB RESULTS: Recent Labs    10/15/23 0817  WBC 8.7  HGB 15.4  HCT 44.6  PLT 118*   BMET Recent Labs    10/15/23 0817 10/16/23 0459  NA 131* 133*  K 4.1 3.5  CL  98 104  CO2 22 23  GLUCOSE 311* 208*  BUN 10 13  CREATININE 0.79 0.39*  CALCIUM 8.8* 8.0*   LFT Recent Labs    10/15/23 0817 10/16/23 0459  PROT 6.8 6.4*  ALBUMIN 2.8* 2.6*  AST 123* 88*  ALT 166* 131*  ALKPHOS 141* 141*  BILITOT 10.1* 13.5*  BILIDIR 6.3*  --   IBILI 3.8*  --    PT/INR No results for input(s): "LABPROT", "INR" in the last 72 hours.  STUDIES: US Abdomen Limited RUQ (LIVER/GB)  Result Date: 10/15/2023 CLINICAL DATA:  Right upper quadrant abdominal pain EXAM: ULTRASOUND ABDOMEN LIMITED RIGHT UPPER QUADRANT COMPARISON:  CT abdomen/pelvis dated 01/03/2009 FINDINGS: Gallbladder: No gallstones or wall thickening visualized. No sonographic Murphy sign noted by sonographer. Common bile duct: Diameter: 4 mm Liver: Nodular hepatic contour with coarse hepatic echotexture, raising the possibility of early hepatocellular disease such as cirrhosis. Portal vein is patent on color Doppler imaging with normal direction of blood flow towards the liver. Other: None. IMPRESSION: Suspected early hepatocellular disease, possibly cirrhosis. Electronically Signed   By: Charline Bills M.D.   On: 10/15/2023 19:55   DG Chest 2 View  Result Date: 10/15/2023 CLINICAL DATA:  CP. EXAM: CHEST - 2 VIEW COMPARISON:  07/06/2019. FINDINGS: Bilateral lung fields are clear. Bilateral costophrenic angles are clear. Normal cardio-mediastinal silhouette. No acute osseous abnormalities. The soft tissues are within normal limits. IMPRESSION: *No active cardiopulmonary disease. Electronically Signed   By: Jules Schick M.D.   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 bili 13.5 AST 88/ALT 131/alk phos 141 Albumin 2.6 CRP 7.9 Negative hepatitis panel Lipase 30 Platelets 118 Previous history of significant alcohol use.  Acute elevation in LFTs.  Suspicious  for alcoholic hepatitis. Cannot calculate MELD score or MDF without PT/INR  DM  Atypical chest pain Relieved with nitroglycerin, negative troponins, echo pending.  Extensive NSAID use.  Possibly gastritis/PUD in the setting of persistent NSAID use. - Cardiology following   Plan   - Serologic evaluation for acute and chronic liver disease: ANA, AMA, IgG, asma, ceruloplasim, alpha-1, iron, ferritin, AFP - Educated patient on the importance of complete cessation of alcohol use moving forward - Continue to trend LFTs - Serial INR, daily - monitor daily MELD - Calculate MDF once labs return and if greater than 32 will initiate prednisolone - Will need EGD at some point to screen for varices and may be beneficial with his atypical chest pain and extensive NSAID use history.    Thank you for your kind consultation, we will continue to follow.   Bayley Leanna Sato  10/16/2023, 8:02 AM    Attending physician's note  I have taken a history, reviewed the chart and examined the patient. I performed a substantive portion of this encounter, including complete performance of at least one of the key components, in conjunction with the APP. I agree with the APP's note, impression and recommendations.    62 year old male with type 2 diabetes, hyperlipidemia, fatty liver, past history of excessive alcohol use presented to ER with chest pain, noted to have significant elevation in total bili  to 13.5 Exam, labs and imaging suggestive of cirrhosis Follow-up PT and INR to calculate MELD and discriminant function score  Right upper quadrant ultrasound negative for portal vein thrombus or obvious hepatic lesions concerning for HCC Follow-up AFP No evidence of volume overload or hepatic encephalopathy  Check autoimmune markers, ferritin to exclude any other etiology for chronic liver disease His brother passed away from complications of cirrhosis  History of excessive NSAID use, heartburn retrosternal  chest pain.  If cardiac workup negative will consider EGD to exclude erosive esophagitis and will also screen for esophageal varices N.p.o. after midnight PPI  The patient was provided an opportunity to ask questions and all were answered. The patient agreed with the plan and demonstrated an understanding of the instructions.  Iona Beard , MD [phone]

## 2023-10-16 NOTE — Inpatient Diabetes Management (Signed)
Inpatient Diabetes Program Recommendations  AACE/ADA: New Consensus Statement on Inpatient Glycemic Control (2015)  Target Ranges:  Prepandial:   less than 140 mg/dL      Peak postprandial:   less than 180 mg/dL (1-2 hours)      Critically ill patients:  140 - 180 mg/dL   Lab Results  Component Value Date   GLUCAP 244 (H) 10/15/2023   HGBA1C 7.8 (H) 10/16/2023    Review of Glycemic Control  Latest Reference Range & Units 10/16/23 04:59  Glucose 70 - 99 mg/dL 782 (H)   Diabetes history: DM 2 Outpatient Diabetes medications: Farxiga 10 mg Daily, Metformin 1000 mg bid Current orders for Inpatient glycemic control:  None  Note: Glucose in the 240's  Inpatient Diabetes Program Recommendations:    -   add Novolog 0-15 units tid + hs  Thanks,  Christena Deem RN, MSN, BC-ADM Inpatient Diabetes Coordinator Team Pager [phone] (8a-5p)

## 2023-10-16 NOTE — Progress Notes (Signed)
PROGRESS NOTE  ROCHESTER SERPE KGM:[id number] DOB: [birth year]   PCP: Philip Aspen, Limmie Patricia, MD  Patient is from: Home.  Independently ambulates at baseline.  DOA: 10/15/2023 LOS: 1  Chief complaints Chief Complaint  Patient presents with   Chest Pain     Brief Narrative / Interim history: 62 year old M with PMH of DM-2, HLD and alcohol use presented to ED with chest pain/heartburn and associated poor p.o. intake for 3 days and admitted for hyperbilirubinemia.  Initially evaluated by cardiology but chest pain felt to be very active call.  His bilirubin was elevated to 10.1 with mildly elevated AST, ALT and ALP.  [facility] consulted.  RUQ [place] with possible cirrhosis but no gallstone obstruction.  Cardiology signed off.  The next day, total bili trended up to 13.5.  CRP 7.9.  Acute hepatitis panel negative.  Evaluated by GI who has initiated workup for hyperbilirubinemia.  Possible EGD in the next 24 to 48 hours.  Subjective: Seen and examined earlier this morning.  No major events overnight of this morning.  No complaints.  Feels better today.  Denies nausea, vomiting or pain.  Reports daily ibuprofen about 2 to 3 tablets a day.  Objective: Vitals:   10/15/23 2152 10/16/23 0204 10/16/23 0600 10/16/23 0936  BP: (!) 168/90 137/68 138/82 (!) 143/78  Pulse: 93 82 80 71  Resp: 18 18 18 18   Temp: 98.3 F (36.8 C) 98.3 F (36.8 C) 98.6 F (37 C) 98.4 F (36.9 C)  TempSrc: Oral   Oral  SpO2: 100% 99%  99%  Weight: 73.7 kg     Height: 6' (1.829 m)       Examination:  GENERAL: No apparent distress.  Nontoxic. HEENT: MMM.  Sclerae icteric. NECK: Supple.  No apparent JVD.  RESP:  No IWOB.  Fair aeration bilaterally. CVS:  RRR. Heart sounds normal.  ABD/GI/GU: BS+. Abd soft, NTND.  MSK/EXT:  Moves extremities. No apparent deformity. No edema.  SKIN: Skin jaundice NEURO: Awake, alert and oriented appropriately.  No apparent focal neuro deficit. PSYCH: Calm. Normal affect.    Procedures:  None  Microbiology summarized: COVID team PCR nonreactive  Assessment and plan: Hyperbilirubinemia/jaundice: Unclear etiology of this.  Hyperbilirubinemia with direct predominance suggesting obstructive etiology.  RUQ [place] without obstruction or gallstone.  Associated mild transaminitis with ALT predominance.  ALP 141.  CRP 7.9 Acute hepatitis panel and HIV nonreactive.  Colo in 02/2018 with one 4 mm tubular adenoma, diverticulosis, internal hemorrhoid and single colonic AVM.  No fever or leukocytosis to suggest infection.  No RUQ tenderness.  Reports drinking up to 4 beers a day.  He is also on Trulicity.  -[facility] following-initiated extensive workup -Possible EGD on 11/6. -Continue monitoriing  Epigastric pain: Due to NSAID? -Continue PPI -Advised against NSAID use  Alcohol use disorder: Reports drinking up to 4 beers a day. Denies Hx of w/l. -Encouraged cessation -Continue thiamine, folic acid and m/vit -Not on CIWA for now   Atypical chest pain: Likely due to the above.  Serial troponin negative.  EKG without acute ischemic finding.Marland Kitchen  NIDDM-2 with hyperglycemia: A1c 7.8%.  On metformin, Trulicity and Farxiga at home. Recent Labs  Lab 10/15/23 2223 10/16/23 0938 10/16/23 1124  GLUCAP 244* 234* 205*  -Continue current insulin regimen  Hyponatremia: Partly due to hyperglycemia. Mild -Monitor  Body mass index is 22.04 kg/m.          DVT prophylaxis:  SCDs Start: 10/15/23 2126  Code Status: Full code  Family Communication: None at bedside Level of care: Telemetry Medical Status is: Inpatient Remains inpatient appropriate because: Hyperbilirubinemia   Final disposition: Home Consultants:  Gastroenterology  50 minutes with more than 50% spent in reviewing records, counseling patient/family and coordinating care.   Sch Meds:  Scheduled Meds:  aspirin EC  81 mg Oral Daily   folic acid  1 mg Oral Daily   insulin aspart  0-5 Units Subcutaneous  QHS   insulin aspart  0-9 Units Subcutaneous TID WC   multivitamin with minerals  1 tablet Oral Daily   pantoprazole (PROTONIX) IV  40 mg Intravenous Once   thiamine  100 mg Oral Daily   Continuous Infusions:  sodium chloride 75 mL/hr at 10/15/23 2225   PRN Meds:.magnesium hydroxide, ondansetron **OR** ondansetron (ZOFRAN) IV  Antimicrobials: Anti-infectives (From admission, onward)    None        I have personally reviewed the following labs and images: CBC: Recent Labs  Lab 10/15/23 0817  WBC 8.7  HGB 15.4  HCT 44.6  MCV 88.8  PLT 118*   BMP &GFR Recent Labs  Lab 10/15/23 0817 10/16/23 0459  NA 131* 133*  K 4.1 3.5  CL 98 104  CO2 22 23  GLUCOSE 311* 208*  BUN 10 13  CREATININE 0.79 0.39*  CALCIUM 8.8* 8.0*  MG  --  1.8   Estimated Creatinine Clearance: 102.4 mL/min (A) (by C-G formula based on SCr of 0.39 mg/dL (L)). Liver & Pancreas: Recent Labs  Lab 10/15/23 0817 10/16/23 0459  AST 123* 88*  ALT 166* 131*  ALKPHOS 141* 141*  BILITOT 10.1* 13.5*  PROT 6.8 6.4*  ALBUMIN 2.8* 2.6*   Recent Labs  Lab 10/15/23 0817  LIPASE 30   No results for input(s): "AMMONIA" in the last 168 hours. Diabetic: Recent Labs    10/16/23 0459  HGBA1C 7.8*   Recent Labs  Lab 10/15/23 2223 10/16/23 0938 10/16/23 1124  GLUCAP 244* 234* 205*   Cardiac Enzymes: No results for input(s): "CKTOTAL", "CKMB", "CKMBINDEX", "TROPONINI" in the last 168 hours. No results for input(s): "PROBNP" in the last 8760 hours. Coagulation Profile: No results for input(s): "INR", "PROTIME" in the last 168 hours. Thyroid Function Tests: Recent Labs    10/16/23 0459  TSH 0.781   Lipid Profile: No results for input(s): "CHOL", "HDL", "LDLCALC", "TRIG", "CHOLHDL", "LDLDIRECT" in the last 72 hours. Anemia Panel: Recent Labs    10/16/23 1010  FERRITIN 477*  TIBC 242*  IRON 89   Urine analysis:    Component Value Date/Time   COLORURINE AMBER (A) 10/15/2023 2018    APPEARANCEUR CLEAR 10/15/2023 2018   LABSPEC 1.032 (H) 10/15/2023 2018   PHURINE 6.0 10/15/2023 2018   GLUCOSEU >=500 (A) 10/15/2023 2018   HGBUR NEGATIVE 10/15/2023 2018   HGBUR negative 10/07/2010 0753   BILIRUBINUR MODERATE (A) 10/15/2023 2018   BILIRUBINUR n 05/12/2016 0953   KETONESUR 5 (A) 10/15/2023 2018   PROTEINUR 30 (A) 10/15/2023 2018   UROBILINOGEN >=8.0 05/12/2016 0953   UROBILINOGEN 4.0 10/07/2010 0753   NITRITE NEGATIVE 10/15/2023 2018   LEUKOCYTESUR NEGATIVE 10/15/2023 2018   Sepsis Labs: Invalid input(s): "PROCALCITONIN", "LACTICIDVEN"  Microbiology: Recent Results (from the past 240 hour(s))  SARS Coronavirus 2 by RT PCR (hospital order, performed in [facility] hospital lab) *cepheid single result test* Anterior Nasal Swab     Status: None   Collection Time: 10/15/23  9:35 PM   Specimen: Anterior Nasal Swab  Result Value Ref Range Status  SARS Coronavirus 2 by RT PCR NEGATIVE NEGATIVE Final    Comment: Performed at [facility], [address], [place], [place] [address]    Radiology Studies: US Abdomen Limited RUQ (LIVER/GB)  Result Date: 10/15/2023 CLINICAL DATA:  Right upper quadrant abdominal pain EXAM: ULTRASOUND ABDOMEN LIMITED RIGHT UPPER QUADRANT COMPARISON:  CT abdomen/pelvis dated 01/03/2009 FINDINGS: Gallbladder: No gallstones or wall thickening visualized. No sonographic Murphy sign noted by sonographer. Common bile duct: Diameter: 4 mm Liver: Nodular hepatic contour with coarse hepatic echotexture, raising the possibility of early hepatocellular disease such as cirrhosis. Portal vein is patent on color Doppler imaging with normal direction of blood flow towards the liver. Other: None. IMPRESSION: Suspected early hepatocellular disease, possibly cirrhosis. Electronically Signed   By: Charline Bills M.D.   On: 10/15/2023 19:55      Nola Botkins T. Wyvonne Carda [facility]  If 7PM-7AM, please contact night-coverage [email or web address] 10/16/2023, 12:21  PM  .  Mild. -Monitor mild. -Monitor 7.9.

## 2023-10-16 NOTE — Progress Notes (Signed)
Patient now under the care of IM/GI. Cardiology will sign off. Plan outpatient echo and follow-up as previously outlined. Please call with questions.

## 2023-10-16 NOTE — Plan of Care (Signed)
  Problem: Education: Goal: Ability to describe self-care measures that may prevent or decrease complications (Diabetes Survival Skills Education) will improve Outcome: Progressing Goal: Individualized Educational Video(s) Outcome: Progressing   Problem: Fluid Volume: Goal: Ability to maintain a balanced intake and output will improve Outcome: Progressing   Problem: Coping: Goal: Ability to adjust to condition or change in health will improve Outcome: Progressing   Problem: Health Behavior/Discharge Planning: Goal: Ability to identify and utilize available resources and services will improve Outcome: Progressing Goal: Ability to manage health-related needs will improve Outcome: Progressing

## 2023-10-16 NOTE — TOC Initial Note (Signed)
Transition of Care [facility]) - Initial/Assessment Note    Patient Details  Name: Dylan Blake MRN: [id number] Date of Birth: Aug 08, 1961  Transition of Care [facility]) CM/SW Contact:    Michaela Corner, LCSWA Phone Number: 10/16/2023, 10:58 AM  Clinical Narrative:   CSW met pt at bedside to complete assessment. Pt stated that he lives with his wife and they have 2 adult children that live locally. Pt reports he his a PCP and [facility] [address]. Pt reports no previous use of HH and no DME at home. Pt drives himself.   Patient Goals and CMS Choice Patient states their goals for this hospitalization and ongoing recovery are:: I walked into the hospital, my plan is to walk out of it the same way          Expected Discharge Plan and Services In-house Referral: Clinical Social Work     Living arrangements for the past 2 months: Single Family Home                                      Prior Living Arrangements/Services Living arrangements for the past 2 months: Single Family Home Lives with:: Spouse Patient language and need for interpreter reviewed:: Yes Do you feel safe going back to the place where you live?: Yes      Need for Family Participation in Patient Care: No (Comment) Care giver support system in place?: No (comment)   Criminal Activity/Legal Involvement Pertinent to Current Situation/Hospitalization: No - Comment as needed  Activities of Daily Living   ADL Screening (condition at time of admission) Independently performs ADLs?: Yes (appropriate for developmental age) Is the patient deaf or have difficulty hearing?: No Does the patient have difficulty seeing, even when wearing glasses/contacts?: No Does the patient have difficulty concentrating, remembering, or making decisions?: No  Permission Sought/Granted                  Emotional Assessment Appearance:: Appears stated age Attitude/Demeanor/Rapport: Engaged Affect (typically  observed): Calm, Appropriate Orientation: : Oriented to Self, Oriented to Place, Oriented to  Time, Oriented to Situation Alcohol / Substance Use: Alcohol Use Psych Involvement: No (comment)  Admission diagnosis:  Jaundice [R17] Atypical chest pain [R07.89] Scleral icterus [R17] Patient Active Problem List   Diagnosis Date Noted   Jaundice 10/15/2023   Chest pain 07/06/2019   DM (diabetes mellitus) (HCC) 05/19/2016   Depressed 02/10/2011   Dyslipidemia 01/17/2008   Essential hypertension 11/18/2007   DIVERTICULOSIS, COLON 11/18/2007   ABDOMINAL TENDERNESS, LEFT LOWER QUADRANT 11/18/2007   PCP:  Philip Aspen, Limmie Patricia, MD [facility]acy[facility]   [phone] [place], [place] - [address] [place] [address] Phone: [phone] Fax: [phone]     Social Determinants of Health (SDOH) Social History: SDOH Screenings   Depression (PHQ2-9): Low Risk  (05/09/2023)  Tobacco Use: Low Risk  (10/15/2023)   SDOH Interventions:     Readmission Risk Interventions     No data to display

## 2023-10-17 ENCOUNTER — Inpatient Hospital Stay (HOSPITAL_COMMUNITY): Payer: Commercial Managed Care - PPO

## 2023-10-17 DIAGNOSIS — K7030 Alcoholic cirrhosis of liver without ascites: Secondary | ICD-10-CM | POA: Diagnosis not present

## 2023-10-17 DIAGNOSIS — E1165 Type 2 diabetes mellitus with hyperglycemia: Secondary | ICD-10-CM

## 2023-10-17 DIAGNOSIS — R17 Unspecified jaundice: Secondary | ICD-10-CM | POA: Diagnosis not present

## 2023-10-17 DIAGNOSIS — I10 Essential (primary) hypertension: Secondary | ICD-10-CM

## 2023-10-17 DIAGNOSIS — R0789 Other chest pain: Secondary | ICD-10-CM | POA: Diagnosis not present

## 2023-10-17 LAB — COMPREHENSIVE METABOLIC PANEL
ALT: 102 U/L — ABNORMAL HIGH (ref 0–44)
AST: 75 U/L — ABNORMAL HIGH (ref 15–41)
Albumin: 2.4 g/dL — ABNORMAL LOW (ref 3.5–5.0)
Alkaline Phosphatase: 140 U/L — ABNORMAL HIGH (ref 38–126)
Anion gap: 8 (ref 5–15)
BUN: 12 mg/dL (ref 6–20)
CO2: 24 mmol/L (ref 22–32)
Calcium: 8.1 mg/dL — ABNORMAL LOW (ref 8.9–10.3)
Chloride: 103 mmol/L (ref 98–111)
Creatinine, Ser: 0.67 mg/dL (ref 0.61–1.24)
GFR, Estimated: >60 mL/min (ref >60)
Glucose, Bld: 208 mg/dL — ABNORMAL HIGH (ref 70–99)
Potassium: 3.4 mmol/L — ABNORMAL LOW (ref 3.5–5.1)
Sodium: 135 mmol/L (ref 135–145)
Total Bilirubin: 16.5 mg/dL — ABNORMAL HIGH (ref <1.2)
Total Protein: 6.5 g/dL (ref 6.5–8.1)

## 2023-10-17 LAB — GLUCOSE, CAPILLARY
Glucose-Capillary: 157 mg/dL — ABNORMAL HIGH (ref 70–99)
Glucose-Capillary: 171 mg/dL — ABNORMAL HIGH (ref 70–99)
Glucose-Capillary: 282 mg/dL — ABNORMAL HIGH (ref 70–99)
Glucose-Capillary: 207 mg/dL — ABNORMAL HIGH (ref 70–99)

## 2023-10-17 LAB — CBC
HCT: 37 % — ABNORMAL LOW (ref 39.0–52.0)
Hemoglobin: 12.8 g/dL — ABNORMAL LOW (ref 13.0–17.0)
MCH: 30.6 pg (ref 26.0–34.0)
MCHC: 34.6 g/dL (ref 30.0–36.0)
MCV: 88.5 fL (ref 80.0–100.0)
Platelets: 94 10*3/uL — ABNORMAL LOW (ref 150–400)
RBC: 4.18 MIL/uL — ABNORMAL LOW (ref 4.22–5.81)
RDW: 12.7 % (ref 11.5–15.5)
WBC: 4.3 10*3/uL (ref 4.0–10.5)
nRBC: 0 % (ref 0.0–0.2)

## 2023-10-17 LAB — ANTI-SMOOTH MUSCLE ANTIBODY, IGG: F-Actin IgG: 23 U — ABNORMAL HIGH (ref 0–19)

## 2023-10-17 LAB — MAGNESIUM: Magnesium: 1.8 mg/dL (ref 1.7–2.4)

## 2023-10-17 LAB — ALPHA-1-ANTITRYPSIN: A-1 Antitrypsin, Ser: 189 mg/dL — ABNORMAL HIGH (ref 101–187)

## 2023-10-17 LAB — ANA W/REFLEX IF POSITIVE: Anti Nuclear Antibody (ANA): NEGATIVE

## 2023-10-17 LAB — CERULOPLASMIN: Ceruloplasmin: 25.7 mg/dL (ref 16.0–31.0)

## 2023-10-17 LAB — AFP TUMOR MARKER: AFP, Serum, Tumor Marker: 2.1 ng/mL (ref 0.0–8.4)

## 2023-10-17 MED ORDER — POTASSIUM CHLORIDE CRYS ER 20 MEQ PO TBCR
40.0000 meq | EXTENDED_RELEASE_TABLET | Freq: Once | ORAL | Status: AC
  Administered 2023-10-17: 40 meq via ORAL
  Filled 2023-10-17: qty 2

## 2023-10-17 MED ORDER — PREDNISOLONE 5 MG PO TABS
40.0000 mg | ORAL_TABLET | Freq: Every day | ORAL | Status: DC
  Administered 2023-10-17 – 2023-10-18 (×2): 40 mg via ORAL
  Filled 2023-10-17 (×3): qty 8

## 2023-10-17 MED ORDER — GADOBUTROL 1 MMOL/ML IV SOLN
7.0000 mL | Freq: Once | INTRAVENOUS | Status: AC | PRN
  Administered 2023-10-17: 7 mL via INTRAVENOUS

## 2023-10-17 MED ORDER — SODIUM CHLORIDE 0.9 % IV SOLN: INTRAVENOUS | Status: DC

## 2023-10-17 NOTE — Plan of Care (Signed)

## 2023-10-17 NOTE — Progress Notes (Signed)
PROGRESS NOTE  Dylan Blake:[id number] DOB: [birth year]   PCP: Philip Aspen, Limmie Patricia, MD  Patient is from: Home.  Independently ambulates at baseline.  DOA: 10/15/2023 LOS: 2  Chief complaints Chief Complaint  Patient presents with   Chest Pain     Brief Narrative / Interim history: 62 year old M with PMH of DM-2, HLD and alcohol use presented to ED with chest pain/heartburn and associated poor p.o. intake for 3 days and admitted for hyperbilirubinemia.  Initially evaluated by cardiology but chest pain felt to be very active call.  His bilirubin was elevated to 10.1 with mildly elevated AST, ALT and ALP.  [facility] consulted.  RUQ [place] with possible cirrhosis but no gallstone obstruction.  Cardiology signed off.  Bilirubin trended up.  CRP 7.9.  Acute hepatitis panel negative.  Started on p.o. prednisolone.  Possible EGD on 11/17.   Subjective: Seen and examined earlier this morning.  No major events overnight of this morning.  No complaints.  Bilirubin trended up to 16.5.  Patient's wife at bedside.  Objective: Vitals:   10/16/23 1625 10/16/23 2051 10/17/23 0437 10/17/23 0800  BP: (!) 150/77 (!) 147/75 125/70 131/78  Pulse: 74 70 74 93  Resp: 18 20 18 18   Temp: 99.3 F (37.4 C) 98.4 F (36.9 C) 98.3 F (36.8 C) 97.8 F (36.6 C)  TempSrc: Oral Oral Oral Oral  SpO2: 98% 97% 97% 99%  Weight:      Height:        Examination:  GENERAL: No apparent distress.  Nontoxic. HEENT: MMM.  Sclerae icteric. NECK: Supple.  No apparent JVD.  RESP:  No IWOB.  Fair aeration bilaterally. CVS:  RRR. Heart sounds normal.  ABD/GI/GU: BS+. Abd soft, NTND.  MSK/EXT:  Moves extremities. No apparent deformity. No edema.  SKIN: Skin jaundice NEURO: Awake, alert and oriented appropriately.  No apparent focal neuro deficit. PSYCH: Calm. Normal affect.   Procedures:  None  Microbiology summarized: COVID team PCR nonreactive  Assessment and plan: Hyperbilirubinemia/jaundice:  Unclear etiology of this.  Hyperbilirubinemia with direct predominance suggesting obstructive etiology.  RUQ [place] without obstruction or gallstone.  Associated mild transaminitis with ALT predominance.  ALP 141.  CRP 7.9 Acute hepatitis panel and HIV nonreactive.  Colo in 02/2018 with one 4 mm tubular adenoma, diverticulosis, internal hemorrhoid and single colonic AVM.  No fever or leukocytosis to suggest infection.  No RUQ tenderness.  Reports drinking up to 4 beers a day.  He is also on Trulicity.  -[facility] following-initiated prednisolone and hepatic work. -Possible EGD at some point. -Continue monitoriing  Epigastric pain: Due to NSAID? -Continue PPI -Advised against NSAID use  Alcohol use disorder: Reports drinking up to 4 beers a day. Denies Hx of w/l. -Encouraged cessation -Continue thiamine, folic acid and m/vit -Not on CIWA for now   Atypical chest pain: Likely due to the above.  Serial troponin negative.  EKG without acute ischemic finding.Marland Kitchen  NIDDM-2 with hyperglycemia: A1c 7.8%.  On metformin, Trulicity and Farxiga at home. Recent Labs  Lab 10/16/23 1124 10/16/23 1624 10/16/23 2050 10/17/23 0718 10/17/23 1111  GLUCAP 205* 219* 244* 157* 171*  -Continue current insulin regimen  Hyponatremia: Partly due to hyperglycemia. Mild -Monitor  Hypokalemia -Monitor and replenish as appropriate  Body mass index is 22.04 kg/m.          DVT prophylaxis:  SCDs Start: 10/15/23 2126  Code Status: Full code Family Communication: Patient's wife at bedside Level of care: Telemetry Medical Status  is: Inpatient Remains inpatient appropriate because: Hyperbilirubinemia   Final disposition: Home Consultants:  GI  35 minutes with more than 50% spent in reviewing records, counseling patient/family and coordinating care.   Sch Meds:  Scheduled Meds:  aspirin EC  81 mg Oral Daily   folic acid  1 mg Oral Daily   insulin aspart  0-5 Units Subcutaneous QHS   insulin aspart   0-9 Units Subcutaneous TID WC   multivitamin with minerals  1 tablet Oral Daily   pantoprazole (PROTONIX) IV  40 mg Intravenous Once   prednisoLONE  40 mg Oral Daily   thiamine  100 mg Oral Daily   Continuous Infusions:   PRN Meds:.magnesium hydroxide, ondansetron **OR** ondansetron (ZOFRAN) IV, oxyCODONE  Antimicrobials: Anti-infectives (From admission, onward)    None        I have personally reviewed the following labs and images: CBC: Recent Labs  Lab 10/15/23 0817 10/17/23 0841  WBC 8.7 4.3  HGB 15.4 12.8*  HCT 44.6 37.0*  MCV 88.8 88.5  PLT 118* 94*   BMP &GFR Recent Labs  Lab 10/15/23 0817 10/16/23 0459 10/17/23 0841  NA 131* 133* 135  K 4.1 3.5 3.4*  CL 98 104 103  CO2 22 23 24   GLUCOSE 311* 208* 208*  BUN 10 13 12   CREATININE 0.79 0.39* 0.67  CALCIUM 8.8* 8.0* 8.1*  MG  --  1.8 1.8   Estimated Creatinine Clearance: 102.4 mL/min (by C-G formula based on SCr of 0.67 mg/dL). Liver & Pancreas: Recent Labs  Lab 10/15/23 0817 10/16/23 0459 10/17/23 0841  AST 123* 88* 75*  ALT 166* 131* 102*  ALKPHOS 141* 141* 140*  BILITOT 10.1* 13.5* 16.5*  PROT 6.8 6.4* 6.5  ALBUMIN 2.8* 2.6* 2.4*   Recent Labs  Lab 10/15/23 0817  LIPASE 30   No results for input(s): "AMMONIA" in the last 168 hours. Diabetic: Recent Labs    10/16/23 0459  HGBA1C 7.8*   Recent Labs  Lab 10/16/23 1124 10/16/23 1624 10/16/23 2050 10/17/23 0718 10/17/23 1111  GLUCAP 205* 219* 244* 157* 171*   Cardiac Enzymes: No results for input(s): "CKTOTAL", "CKMB", "CKMBINDEX", "TROPONINI" in the last 168 hours. No results for input(s): "PROBNP" in the last 8760 hours. Coagulation Profile: Recent Labs  Lab 10/16/23 1010  INR 1.3*   Thyroid Function Tests: Recent Labs    10/16/23 0459  TSH 0.781   Lipid Profile: No results for input(s): "CHOL", "HDL", "LDLCALC", "TRIG", "CHOLHDL", "LDLDIRECT" in the last 72 hours. Anemia Panel: Recent Labs    10/16/23 1010   FERRITIN 477*  TIBC 242*  IRON 89   Urine analysis:    Component Value Date/Time   COLORURINE AMBER (A) 10/15/2023 2018   APPEARANCEUR CLEAR 10/15/2023 2018   LABSPEC 1.032 (H) 10/15/2023 2018   PHURINE 6.0 10/15/2023 2018   GLUCOSEU >=500 (A) 10/15/2023 2018   HGBUR NEGATIVE 10/15/2023 2018   HGBUR negative 10/07/2010 0753   BILIRUBINUR MODERATE (A) 10/15/2023 2018   BILIRUBINUR n 05/12/2016 0953   KETONESUR 5 (A) 10/15/2023 2018   PROTEINUR 30 (A) 10/15/2023 2018   UROBILINOGEN >=8.0 05/12/2016 0953   UROBILINOGEN 4.0 10/07/2010 0753   NITRITE NEGATIVE 10/15/2023 2018   LEUKOCYTESUR NEGATIVE 10/15/2023 2018   Sepsis Labs: Invalid input(s): "PROCALCITONIN", "LACTICIDVEN"  Microbiology: Recent Results (from the past 240 hour(s))  SARS Coronavirus 2 by RT PCR (hospital order, performed in [facility] hospital lab) *cepheid single result test* Anterior Nasal Swab     Status: None  Collection Time: 10/15/23  9:35 PM   Specimen: Anterior Nasal Swab  Result Value Ref Range Status   SARS Coronavirus 2 by RT PCR NEGATIVE NEGATIVE Final    Comment: Performed at [facility], [address] [place], [place] [address]    Radiology Studies: No results found.    Brynnleigh Mcelwee T. Regina Ganci [facility]  If 7PM-7AM, please contact night-coverage [email or web address] 10/17/2023, 2:46 PM  .  Mild. -Monitor mild. -Monitor 7.9.

## 2023-10-17 NOTE — Inpatient Diabetes Management (Signed)
Inpatient Diabetes Program Recommendations  AACE/ADA: New Consensus Statement on Inpatient Glycemic Control (2015)  Target Ranges:  Prepandial:   less than 140 mg/dL      Peak postprandial:   less than 180 mg/dL (1-2 hours)      Critically ill patients:  140 - 180 mg/dL   Lab Results  Component Value Date   GLUCAP 157 (H) 10/17/2023   HGBA1C 7.8 (H) 10/16/2023    Review of Glycemic Control  Latest Reference Range & Units 10/16/23 09:38 10/16/23 11:24 10/16/23 16:24 10/16/23 20:50 10/17/23 07:18 10/17/23 11:11  Glucose-Capillary 70 - 99 mg/dL 469 (H) 629 (H) 528 (H) 244 (H) 157 (H) 171 (H)   Diabetes history: DM 2 Outpatient Diabetes medications: Farxiga 10 mg Daily, Metformin 1000 mg bid Current orders for Inpatient glycemic control:  Novolog 0-9 units tid + hs  PO prednisone 40 mg Daily  Inpatient Diabetes Program Recommendations:    Note: pt started on PO prednisone today  -   add Novolog 3 units tid meal coverage if eating>50% of meals  Thanks,  Christena Deem RN, MSN, BC-ADM Inpatient Diabetes Coordinator Team Pager [phone] (8a-5p)

## 2023-10-17 NOTE — Progress Notes (Addendum)
Progress Note   LOS: 2 days   Chief Complaint:Elevated LFTs   Subjective   Wife at bedside.  Patient states he is doing well.  Tolerating diet without difficulty.  States his chest pain is better.  Denies any other symptoms.   Objective   Vital signs in last 24 hours: Temp:  [97.8 F (36.6 C)-99.3 F (37.4 C)] 97.8 F (36.6 C) (11/06 0800) Pulse Rate:  [70-93] 93 (11/06 0800) Resp:  [18-20] 18 (11/06 0800) BP: (125-150)/(70-78) 131/78 (11/06 0800) SpO2:  [97 %-99 %] 99 % (11/06 0800)   Last BM recorded by nurses in past 5 days No data recorded  General:   male in no acute distress, jaundiced, scleral icterus Heart:  Regular rate and rhythm; no murmurs Pulm: Clear anteriorly; no wheezing Abdomen: soft, nondistended, normal bowel sounds in all quadrants. Nontender without guarding. No organomegaly appreciated. Extremities:  No edema Neurologic:  Alert and  oriented x4;  No focal deficits.  Psych:  Cooperative. Normal mood and affect.  Intake/Output from previous day: 11/05 0701 - 11/06 0700 In: 480 [P.O.:480] Out: 0  Intake/Output this shift: Total I/O In: 240 [P.O.:240] Out: -   Studies/Results: US Abdomen Limited RUQ (LIVER/GB)  Result Date: 10/15/2023 CLINICAL DATA:  Right upper quadrant abdominal pain EXAM: ULTRASOUND ABDOMEN LIMITED RIGHT UPPER QUADRANT COMPARISON:  CT abdomen/pelvis dated 01/03/2009 FINDINGS: Gallbladder: No gallstones or wall thickening visualized. No sonographic Murphy sign noted by sonographer. Common bile duct: Diameter: 4 mm Liver: Nodular hepatic contour with coarse hepatic echotexture, raising the possibility of early hepatocellular disease such as cirrhosis. Portal vein is patent on color Doppler imaging with normal direction of blood flow towards the liver. Other: None. IMPRESSION: Suspected early hepatocellular disease, possibly cirrhosis. Electronically Signed   By: Charline Bills M.D.   On: 10/15/2023 19:55    Lab Results: Recent  Labs    10/15/23 0817 10/17/23 0841  WBC 8.7 4.3  HGB 15.4 12.8*  HCT 44.6 37.0*  PLT 118* 94*   BMET Recent Labs    10/15/23 0817 10/16/23 0459 10/17/23 0841  NA 131* 133* 135  K 4.1 3.5 3.4*  CL 98 104 103  CO2 22 23 24   GLUCOSE 311* 208* 208*  BUN 10 13 12   CREATININE 0.79 0.39* 0.67  CALCIUM 8.8* 8.0* 8.1*   LFT Recent Labs    10/15/23 0817 10/16/23 0459 10/17/23 0841  PROT 6.8   < > 6.5  ALBUMIN 2.8*   < > 2.4*  AST 123*   < > 75*  ALT 166*   < > 102*  ALKPHOS 141*   < > 140*  BILITOT 10.1*   < > 16.5*  BILIDIR 6.3*  --   --   IBILI 3.8*  --   --    < > = values in this interval not displayed.   PT/INR Recent Labs    10/16/23 1010  LABPROT 16.2*  INR 1.3*     Scheduled Meds:  aspirin EC  81 mg Oral Daily   folic acid  1 mg Oral Daily   insulin aspart  0-5 Units Subcutaneous QHS   insulin aspart  0-9 Units Subcutaneous TID WC   multivitamin with minerals  1 tablet Oral Daily   pantoprazole (PROTONIX) IV  40 mg Intravenous Once   potassium chloride  40 mEq Oral Once   thiamine  100 mg Oral Daily   Continuous Infusions:    Patient profile:   62 year old male presenting to  ED with retrosternal chest pain with negative cardiac workup so far and excessive NSAID use also found to have elevated LFTs and cirrhosis on ultrasound with history of alcohol abuse   Impression:   Elevated LFTs, 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  Awaiting ASMA. - MDF greater than 32, will start prednisolone 40 Mg 1 tablet/day for 28 days and reassess at day 7 with Lille score - Continue to trend LFTs - Educated on cessation of alcohol use   Atypical chest pain Relieved with nitroglycerin, negative troponins, echo pending.  Extensive NSAID use.  Possibly  gastritis/PUD in the setting of persistent NSAID use. - Cardiology has signed off - Can consider EGD tomorrow for evaluation of gastritis/PUD - N.p.o. midnight - PPI twice daily   Bayley M McMichael  10/17/2023, 10:39 AM   Attending physician's note   I have taken a history, reviewed the chart and examined the patient. I performed a substantive portion of this encounter, including complete performance of at least one of the key components, in conjunction with the APP. I agree with the APP's note, impression and recommendations.    Bilirubin continues to trend up [facility] discriminant function score >32, will initiate prednisolone 40 mg daily, calculate Lilly score at day 7 to assess response to steroids  MELD 23  Will obtain MRI MRCP to exclude biliary stricture or any mass lesion Discussed dietary modifications with low-carb high-protein diet, advised patient to avoid soda, sugary drinks and alcohol  Atypical chest pain, history of frequent NSAID use, esophageal varices screening Will tentatively plan for EGD tomorrow based on anesthesia availability n.p.o. after midnight  The patient was provided an opportunity to ask questions and all were answered. The patient agreed with the plan and demonstrated an understanding of the instructions.   Iona Beard , MD [phone]

## 2023-10-18 ENCOUNTER — Encounter (HOSPITAL_COMMUNITY)
Admission: EM | Disposition: A | Discharge status: Home / Self Care | Payer: Self-pay | Source: Home / Self Care | Attending: Student

## 2023-10-18 DIAGNOSIS — I10 Essential (primary) hypertension: Secondary | ICD-10-CM | POA: Diagnosis not present

## 2023-10-18 DIAGNOSIS — R17 Unspecified jaundice: Secondary | ICD-10-CM | POA: Diagnosis not present

## 2023-10-18 DIAGNOSIS — K831 Obstruction of bile duct: Secondary | ICD-10-CM | POA: Diagnosis not present

## 2023-10-18 DIAGNOSIS — E1165 Type 2 diabetes mellitus with hyperglycemia: Secondary | ICD-10-CM | POA: Diagnosis not present

## 2023-10-18 DIAGNOSIS — R0789 Other chest pain: Secondary | ICD-10-CM | POA: Diagnosis not present

## 2023-10-18 LAB — GLUCOSE, CAPILLARY
Glucose-Capillary: 198 mg/dL — ABNORMAL HIGH (ref 70–99)
Glucose-Capillary: 124 mg/dL — ABNORMAL HIGH (ref 70–99)
Glucose-Capillary: 275 mg/dL — ABNORMAL HIGH (ref 70–99)
Glucose-Capillary: 317 mg/dL — ABNORMAL HIGH (ref 70–99)

## 2023-10-18 LAB — COMPREHENSIVE METABOLIC PANEL
ALT: 91 U/L — ABNORMAL HIGH (ref 0–44)
AST: 70 U/L — ABNORMAL HIGH (ref 15–41)
Albumin: 2.1 g/dL — ABNORMAL LOW (ref 3.5–5.0)
Alkaline Phosphatase: 156 U/L — ABNORMAL HIGH (ref 38–126)
Anion gap: 9 (ref 5–15)
BUN: 13 mg/dL (ref 6–20)
CO2: 21 mmol/L — ABNORMAL LOW (ref 22–32)
Calcium: 8.2 mg/dL — ABNORMAL LOW (ref 8.9–10.3)
Chloride: 104 mmol/L (ref 98–111)
Creatinine, Ser: 0.56 mg/dL — ABNORMAL LOW (ref 0.61–1.24)
GFR, Estimated: >60 mL/min (ref >60)
Glucose, Bld: 279 mg/dL — ABNORMAL HIGH (ref 70–99)
Potassium: 3.9 mmol/L (ref 3.5–5.1)
Sodium: 134 mmol/L — ABNORMAL LOW (ref 135–145)
Total Bilirubin: 17 mg/dL — ABNORMAL HIGH (ref <1.2)
Total Protein: 5.9 g/dL — ABNORMAL LOW (ref 6.5–8.1)

## 2023-10-18 LAB — CBC
HCT: 34.4 % — ABNORMAL LOW (ref 39.0–52.0)
Hemoglobin: 12.2 g/dL — ABNORMAL LOW (ref 13.0–17.0)
MCH: 31 pg (ref 26.0–34.0)
MCHC: 35.5 g/dL (ref 30.0–36.0)
MCV: 87.3 fL (ref 80.0–100.0)
Platelets: 105 10*3/uL — ABNORMAL LOW (ref 150–400)
RBC: 3.94 MIL/uL — ABNORMAL LOW (ref 4.22–5.81)
RDW: 12.8 % (ref 11.5–15.5)
WBC: 5.3 10*3/uL (ref 4.0–10.5)
nRBC: 0.4 % — ABNORMAL HIGH (ref 0.0–0.2)

## 2023-10-18 SURGERY — ESOPHAGOGASTRODUODENOSCOPY (EGD)
Anesthesia: Monitor Anesthesia Care

## 2023-10-18 MED ORDER — INSULIN GLARGINE-YFGN 100 UNIT/ML ~~LOC~~ SOLN: 10.0000 [IU] | Freq: Every day | SUBCUTANEOUS | Status: DC

## 2023-10-18 MED ORDER — SODIUM CHLORIDE 0.9 % IV SOLN: INTRAVENOUS | Status: DC

## 2023-10-18 MED ORDER — CIPROFLOXACIN IN D5W 400 MG/200ML IV SOLN
400.0000 mg | Freq: Once | INTRAVENOUS | Status: AC
  Administered 2023-10-19: 400 mg via INTRAVENOUS

## 2023-10-18 MED ORDER — INSULIN ASPART 100 UNIT/ML IJ SOLN
0.0000 [IU] | Freq: Three times a day (TID) | INTRAMUSCULAR | Status: DC
Start: 2023-10-18 — End: 2023-10-21
  Administered 2023-10-18: 2 [IU] via SUBCUTANEOUS
  Administered 2023-10-18: 8 [IU] via SUBCUTANEOUS
  Administered 2023-10-18: 3 [IU] via SUBCUTANEOUS
  Administered 2023-10-19: 2 [IU] via SUBCUTANEOUS
  Administered 2023-10-19: 1 [IU] via SUBCUTANEOUS
  Administered 2023-10-19: 5 [IU] via SUBCUTANEOUS
  Administered 2023-10-20: 3 [IU] via SUBCUTANEOUS
  Administered 2023-10-20: 5 [IU] via SUBCUTANEOUS
  Administered 2023-10-20 – 2023-10-21 (×2): 3 [IU] via SUBCUTANEOUS
  Administered 2023-10-21: 5 [IU] via SUBCUTANEOUS

## 2023-10-18 MED ORDER — INSULIN ASPART 100 UNIT/ML IJ SOLN: 3.0000 [IU] | Freq: Three times a day (TID) | INTRAMUSCULAR | Status: DC

## 2023-10-18 MED ORDER — INDOMETHACIN 50 MG RE SUPP
100.0000 mg | Freq: Once | RECTAL | Status: DC
  Filled 2023-10-18: qty 2

## 2023-10-18 NOTE — Plan of Care (Signed)
Care plan  

## 2023-10-18 NOTE — Inpatient Diabetes Management (Signed)
Inpatient Diabetes Program Recommendations  AACE/ADA: New Consensus Statement on Inpatient Glycemic Control (2015)  Target Ranges:  Prepandial:   less than 140 mg/dL      Peak postprandial:   less than 180 mg/dL (1-2 hours)      Critically ill patients:  140 - 180 mg/dL   Lab Results  Component Value Date   GLUCAP 198 (H) 10/18/2023   HGBA1C 7.8 (H) 10/16/2023    Review of Glycemic Control   Latest Reference Range & Units 10/17/23 07:18 10/17/23 11:11 10/17/23 16:43 10/17/23 22:41 10/18/23 07:19  Glucose-Capillary 70 - 99 mg/dL 841 (H) 324 (H) 401 (H) 207 (H) 198 (H)   Diabetes history: DM 2 Outpatient Diabetes medications: Farxiga 10 mg Daily, Metformin 1000 mg bid Current orders for Inpatient glycemic control:  Novolog 0-9 units tid + hs  PO prednisone 40 mg Daily  Inpatient Diabetes Program Recommendations:    Note: pt started on PO prednisone yesterday, glucose trends increase after PO intake and steroid dose.  -   add Novolog 3 units tid meal coverage if eating>50% of meals  Thanks,  Christena Deem RN, MSN, BC-ADM Inpatient Diabetes Coordinator Team Pager [phone] (8a-5p)

## 2023-10-18 NOTE — TOC Progression Note (Signed)
Transition of Care [facility]) - Progression Note    Patient Details  Name: Dylan Blake MRN: [id number] Date of Birth: Apr 13, 1961  [facility]) CM/SW Contact  Tom-Johnson, Hershal Coria, RN Phone Number: 10/18/2023, 12:10 PM  Clinical Narrative:     Patient's Bilirubin trended up at 16.5. Plan for possible EGD. Started on Oral Prednisone. Continues on Thiamine, Folic Acid and Multivitamin for Alcohol Disorder. GI following.   Patient not Medically ready for discharge.  CM will continue to follow as patient progresses with care towards discharge.           Expected Discharge Plan and Services In-house Referral: Clinical Social Work     Living arrangements for the past 2 months: Single Family Home                                       Social Determinants of Health (SDOH) Interventions SDOH Screenings   Food Insecurity: No Food Insecurity (10/17/2023)  Housing: Low Risk  (10/17/2023)  Transportation Needs: No Transportation Needs (10/17/2023)  Utilities: Not At Risk (10/17/2023)  Depression (PHQ2-9): Low Risk  (05/09/2023)  Tobacco Use: Low Risk  (10/15/2023)    Readmission Risk Interventions     No data to display

## 2023-10-18 NOTE — Progress Notes (Signed)
PROGRESS NOTE  Dylan Blake ZOX:[id number] DOB: October 08, 1961   PCP: Philip Aspen, Limmie Patricia, MD  Patient is from: Home.  Independently ambulates at baseline.  DOA: 10/15/2023 LOS: 3  Chief complaints Chief Complaint  Patient presents with   Chest Pain     Brief Narrative / Interim history: 62 year old M with PMH of DM-2, HLD and alcohol use presented to ED with chest pain/heartburn and associated poor p.o. intake for 3 days and admitted for hyperbilirubinemia.  Initially evaluated by cardiology but chest pain felt to be very active call.  His bilirubin was elevated to 10.1 with mildly elevated AST, ALT and ALP.  [facility] consulted.  RUQ [place] with possible cirrhosis but no gallstone obstruction.  Cardiology signed off.  Bilirubin trended up.  CRP 7.9.  Acute hepatitis panel negative.  Started on p.o. prednisolone.  MRCP with mildly distended gallbladder with layering gallbladder sludge, mild intrahepatic ductal dilation and CBD of 7 mmHg and suspected mild sludge in distal CBD.  For ERCP on 11/8.   Subjective: Seen and examined earlier this morning.  No major events overnight of this morning.  No complaints.  Feels hungry. Objective: Vitals:   10/17/23 0800 10/17/23 1641 10/18/23 0443 10/18/23 0717  BP: 131/78 (!) 121/58 (!) 108/56 117/61  Pulse: 93 76 71 70  Resp: 18 18 17 18   Temp: 97.8 F (36.6 C) 98.3 F (36.8 C) 97.7 F (36.5 C) 97.9 F (36.6 C)  TempSrc: Oral Oral Oral   SpO2: 99% 97% 98% 100%  Weight:      Height:        Examination:  GENERAL: No apparent distress.  Nontoxic. HEENT: MMM.  Sclerae icteric. NECK: Supple.  No apparent JVD.  RESP:  No IWOB.  Fair aeration bilaterally. CVS:  RRR. Heart sounds normal.  ABD/GI/GU: BS+. Abd soft, NTND.  MSK/EXT:  Moves extremities. No apparent deformity. No edema.  SKIN: Skin jaundice NEURO: Awake, alert and oriented appropriately.  No apparent focal neuro deficit. PSYCH: Calm. Normal affect.   Procedures:   None  Microbiology summarized: COVID team PCR nonreactive  Assessment and plan: Hyperbilirubinemia/jaundice: No significant symptoms.  MRCP raises concern for sludge in gallbladder and CBD.  Reports drinking up to 4 beers a day.  He is also on Trulicity as well.  Bilirubin leveled off at 17. -[facility] following-initiated prednisolone and hepatic work. -ERCP on 11/8. -Continue monitoriing  Epigastric pain: Due to NSAID? -Continue PPI -Advised against NSAID use  Alcohol use disorder: Reports drinking up to 4 beers a day. Denies Hx of w/l. -Encouraged cessation -Continue thiamine, folic acid and m/vit -Not on CIWA for now   Atypical chest pain: Likely due to the above.  Serial troponin negative.  EKG without acute ischemic finding.Marland Kitchen  NIDDM-2 with hyperglycemia: A1c 7.8%.  On metformin, Trulicity and Farxiga at home. Recent Labs  Lab 10/17/23 1111 10/17/23 1643 10/17/23 2241 10/18/23 0719 10/18/23 1120  GLUCAP 171* 282* 207* 198* 124*  -Increased SSI to moderate -Hold off basal insulin until ERCP tomorrow  Hyponatremia: Partly due to hyperglycemia. Mild -Monitor  Hypokalemia -Monitor and replenish as appropriate  Body mass index is 22.04 kg/m.          DVT prophylaxis:  SCDs Start: 10/15/23 2126  Code Status: Full code Family Communication: Patient's wife at bedside Level of care: Telemetry Medical Status is: Inpatient Remains inpatient appropriate because: Hyperbilirubinemia   Final disposition: Home Consultants:  GI  35 minutes with more than 50% spent in reviewing  records, counseling patient/family and coordinating care.   Sch Meds:  Scheduled Meds:  aspirin EC  81 mg Oral Daily   folic acid  1 mg Oral Daily   insulin aspart  0-15 Units Subcutaneous TID WC   insulin aspart  0-5 Units Subcutaneous QHS   multivitamin with minerals  1 tablet Oral Daily   pantoprazole (PROTONIX) IV  40 mg Intravenous Once   prednisoLONE  40 mg Oral Daily    thiamine  100 mg Oral Daily   Continuous Infusions:  sodium chloride 20 mL/hr at 10/17/23 2247    PRN Meds:.magnesium hydroxide, ondansetron **OR** ondansetron (ZOFRAN) IV, oxyCODONE  Antimicrobials: Anti-infectives (From admission, onward)    None        I have personally reviewed the following labs and images: CBC: Recent Labs  Lab 10/15/23 0817 10/17/23 0841 10/18/23 0529  WBC 8.7 4.3 5.3  HGB 15.4 12.8* 12.2*  HCT 44.6 37.0* 34.4*  MCV 88.8 88.5 87.3  PLT 118* 94* 105*   BMP &GFR Recent Labs  Lab 10/15/23 0817 10/16/23 0459 10/17/23 0841 10/18/23 0529  NA 131* 133* 135 134*  K 4.1 3.5 3.4* 3.9  CL 98 104 103 104  CO2 22 23 24  21*  GLUCOSE 311* 208* 208* 279*  BUN 10 13 12 13   CREATININE 0.79 0.39* 0.67 0.56*  CALCIUM 8.8* 8.0* 8.1* 8.2*  MG  --  1.8 1.8  --    Estimated Creatinine Clearance: 102.4 mL/min (A) (by C-G formula based on SCr of 0.56 mg/dL (L)). Liver & Pancreas: Recent Labs  Lab 10/15/23 0817 10/16/23 0459 10/17/23 0841 10/18/23 0529  AST 123* 88* 75* 70*  ALT 166* 131* 102* 91*  ALKPHOS 141* 141* 140* 156*  BILITOT 10.1* 13.5* 16.5* 17.0*  PROT 6.8 6.4* 6.5 5.9*  ALBUMIN 2.8* 2.6* 2.4* 2.1*   Recent Labs  Lab 10/15/23 0817  LIPASE 30   No results for input(s): "AMMONIA" in the last 168 hours. Diabetic: Recent Labs    10/16/23 0459  HGBA1C 7.8*   Recent Labs  Lab 10/17/23 1111 10/17/23 1643 10/17/23 2241 10/18/23 0719 10/18/23 1120  GLUCAP 171* 282* 207* 198* 124*   Cardiac Enzymes: No results for input(s): "CKTOTAL", "CKMB", "CKMBINDEX", "TROPONINI" in the last 168 hours. No results for input(s): "PROBNP" in the last 8760 hours. Coagulation Profile: Recent Labs  Lab 10/16/23 1010  INR 1.3*   Thyroid Function Tests: Recent Labs    10/16/23 0459  TSH 0.781   Lipid Profile: No results for input(s): "CHOL", "HDL", "LDLCALC", "TRIG", "CHOLHDL", "LDLDIRECT" in the last 72 hours. Anemia Panel: Recent Labs     10/16/23 1010  FERRITIN 477*  TIBC 242*  IRON 89   Urine analysis:    Component Value Date/Time   COLORURINE AMBER (A) 10/15/2023 2018   APPEARANCEUR CLEAR 10/15/2023 2018   LABSPEC 1.032 (H) 10/15/2023 2018   PHURINE 6.0 10/15/2023 2018   GLUCOSEU >=500 (A) 10/15/2023 2018   HGBUR NEGATIVE 10/15/2023 2018   HGBUR negative 10/07/2010 0753   BILIRUBINUR MODERATE (A) 10/15/2023 2018   BILIRUBINUR n 05/12/2016 0953   KETONESUR 5 (A) 10/15/2023 2018   PROTEINUR 30 (A) 10/15/2023 2018   UROBILINOGEN >=8.0 05/12/2016 0953   UROBILINOGEN 4.0 10/07/2010 0753   NITRITE NEGATIVE 10/15/2023 2018   LEUKOCYTESUR NEGATIVE 10/15/2023 2018   Sepsis Labs: Invalid input(s): "PROCALCITONIN", "LACTICIDVEN"  Microbiology: Recent Results (from the past 240 hour(s))  SARS Coronavirus 2 by RT PCR (hospital order, performed in [facility] hospital  lab) *cepheid single result test* Anterior Nasal Swab     Status: None   Collection Time: 10/15/23  9:35 PM   Specimen: Anterior Nasal Swab  Result Value Ref Range Status   SARS Coronavirus 2 by RT PCR NEGATIVE NEGATIVE Final    Comment: Performed at [facility], [address], [place], [place] [address]    Radiology Studies: MR ABDOMEN MRCP W WO CONTAST  Result Date: 10/18/2023 CLINICAL DATA:  Jaundice, elevated bilirubin EXAM: MRI ABDOMEN WITHOUT AND WITH CONTRAST (INCLUDING MRCP) TECHNIQUE: Multiplanar multisequence MR imaging of the abdomen was performed both before and after the administration of intravenous contrast. Heavily T2-weighted images of the biliary and pancreatic ducts were obtained, and three-dimensional MRCP images were rendered by post processing. CONTRAST:  7mL GADAVIST GADOBUTROL 1 MMOL/ML IV SOLN COMPARISON:  Right upper quadrant ultrasound dated 10/15/2023 FINDINGS: Lower chest: Lung bases are clear. Hepatobiliary: Nodular hepatic contour, suggesting cirrhosis. Scattered fibrosis in the left hepatic lobe. No  suspicious/enhancing hepatic lesions. Mildly distended gallbladder with layering gallbladder sludge (series 3/image 21). Mild central intrahepatic ductal dilatation. Common duct measures 7 mm (series 2/image 25), within the upper limits of normal. Suspected mild sludge in the distal CBD (series 3/image 23). Pancreas:  Within normal limits. Spleen:  Within the upper limits of normal for size. Adrenals/Urinary Tract:  Adrenal glands are within normal limits. Kidneys are within normal limits.  No hydronephrosis. Stomach/Bowel: Stomach is within normal limits. Visualized bowel is unremarkable. Vascular/Lymphatic:  No evidence of abdominal aortic aneurysm. Portal vein is patent. No suspicious abdominal lymphadenopathy. Other:  No abdominal ascites. Musculoskeletal: No focal osseous lesions. IMPRESSION: Mildly distended gallbladder with layering gallbladder sludge. Mild central intrahepatic ductal dilatation. Common duct measures 7 mm, within the upper limits of normal. Suspected mild sludge in the distal CBD. ERCP is suggested for further evaluation. Cirrhosis. No suspicious/enhancing hepatic lesions. Electronically Signed   By: Charline Bills M.D.   On: 10/18/2023 00:02      Varnell Donate T. Chi Woodham [facility]  If 7PM-7AM, please contact night-coverage [email or web address] 10/18/2023, 2:16 PM  .  Mild. -Monitor mild. -Monitor 7.9.

## 2023-10-18 NOTE — Plan of Care (Signed)

## 2023-10-18 NOTE — Progress Notes (Addendum)
Progress Note   LOS: 3 days   Chief Complaint: Elevated LFTs   Subjective   Patient states he is doing well today.  Does not have any chest pain.  No complaints.   Objective   Vital signs in last 24 hours: Temp:  [97.7 F (36.5 C)-98.3 F (36.8 C)] 97.9 F (36.6 C) (11/07 0717) Pulse Rate:  [70-76] 70 (11/07 0717) Resp:  [17-18] 18 (11/07 0717) BP: (108-121)/(56-61) 117/61 (11/07 0717) SpO2:  [97 %-100 %] 100 % (11/07 0717) Last BM Date : 10/13/23 Last BM recorded by nurses in past 5 days No data recorded  General:   male in no acute distress, jaundiced, scleral icterus Heart:  Regular rate and rhythm; no murmurs Pulm: Clear anteriorly; no wheezing Abdomen: soft, nondistended, normal bowel sounds in all quadrants. Nontender without guarding. No organomegaly appreciated. Extremities:  No edema Neurologic:  Alert and  oriented x4;  No focal deficits.  Psych:  Cooperative. Normal mood and affect.  Intake/Output from previous day: 11/06 0701 - 11/07 0700 In: 360.6 [P.O.:360; I.V.:0.6] Out: 0  Intake/Output this shift: No intake/output data recorded.  Studies/Results: MR ABDOMEN MRCP W WO CONTAST  Result Date: 10/18/2023 CLINICAL DATA:  Jaundice, elevated bilirubin EXAM: MRI ABDOMEN WITHOUT AND WITH CONTRAST (INCLUDING MRCP) TECHNIQUE: Multiplanar multisequence MR imaging of the abdomen was performed both before and after the administration of intravenous contrast. Heavily T2-weighted images of the biliary and pancreatic ducts were obtained, and three-dimensional MRCP images were rendered by post processing. CONTRAST:  7mL GADAVIST GADOBUTROL 1 MMOL/ML IV SOLN COMPARISON:  Right upper quadrant ultrasound dated 10/15/2023 FINDINGS: Lower chest: Lung bases are clear. Hepatobiliary: Nodular hepatic contour, suggesting cirrhosis. Scattered fibrosis in the left hepatic lobe. No suspicious/enhancing hepatic lesions. Mildly distended gallbladder with layering gallbladder sludge  (series 3/image 21). Mild central intrahepatic ductal dilatation. Common duct measures 7 mm (series 2/image 25), within the upper limits of normal. Suspected mild sludge in the distal CBD (series 3/image 23). Pancreas:  Within normal limits. Spleen:  Within the upper limits of normal for size. Adrenals/Urinary Tract:  Adrenal glands are within normal limits. Kidneys are within normal limits.  No hydronephrosis. Stomach/Bowel: Stomach is within normal limits. Visualized bowel is unremarkable. Vascular/Lymphatic:  No evidence of abdominal aortic aneurysm. Portal vein is patent. No suspicious abdominal lymphadenopathy. Other:  No abdominal ascites. Musculoskeletal: No focal osseous lesions. IMPRESSION: Mildly distended gallbladder with layering gallbladder sludge. Mild central intrahepatic ductal dilatation. Common duct measures 7 mm, within the upper limits of normal. Suspected mild sludge in the distal CBD. ERCP is suggested for further evaluation. Cirrhosis. No suspicious/enhancing hepatic lesions. Electronically Signed   By: Charline Bills M.D.   On: 10/18/2023 00:02    Lab Results: Recent Labs    10/17/23 0841 10/18/23 0529  WBC 4.3 5.3  HGB 12.8* 12.2*  HCT 37.0* 34.4*  PLT 94* 105*   BMET Recent Labs    10/16/23 0459 10/17/23 0841 10/18/23 0529  NA 133* 135 134*  K 3.5 3.4* 3.9  CL 104 103 104  CO2 23 24 21*  GLUCOSE 208* 208* 279*  BUN 13 12 13   CREATININE 0.39* 0.67 0.56*  CALCIUM 8.0* 8.1* 8.2*   LFT Recent Labs    10/18/23 0529  PROT 5.9*  ALBUMIN 2.1*  AST 70*  ALT 91*  ALKPHOS 156*  BILITOT 17.0*   PT/INR Recent Labs    10/16/23 1010  LABPROT 16.2*  INR 1.3*     Scheduled Meds:  aspirin EC  81 mg Oral Daily   folic acid  1 mg Oral Daily   insulin aspart  0-15 Units Subcutaneous TID WC   insulin aspart  0-5 Units Subcutaneous QHS   multivitamin with minerals  1 tablet Oral Daily   pantoprazole (PROTONIX) IV  40 mg Intravenous Once   prednisoLONE  40  mg Oral Daily   thiamine  100 mg Oral Daily   Continuous Infusions:  sodium chloride 20 mL/hr at 10/17/23 2247      Patient profile:   62 year old male presenting to ED with retrosternal chest pain with negative cardiac workup so far and excessive NSAID use also found to have elevated LFTs and cirrhosis on ultrasound with history of alcohol abuse    Impression:   Elevated LFTs  suspected alcoholic cirrhosis with component of alcoholic hepatitis and sludge in distal CBD resulting in mild obstruction RUQ ultrasound with probable cirrhosis, patent portal vein MELD 3.0: 24 at 10/18/23 5:29 AM MDF 35.8 T bili 17.0, increasing AST 70/ALT 91/alk phos 156 AFP normal, serologic workup for elevated LFTs has been negative. MRCP shows mildly distended gallbladder with layering gallbladder sludge and mild intrahepatic ductal dilation (CBD 7 mm) and mild sludge in distal CBD. - Continue to trend LFTs - Educated on cessation of alcohol use - Plan for ERCP tomorrow - I thoroughly discussed the procedure with the patient (at bedside) to include nature of the procedure, alternatives, benefits, and risks (including but not limited to bleeding, infection, perforation, anesthesia/cardiac pulmonary complications).  Patient verbalized understanding and gave verbal consent to proceed with procedure.  - N.p.o. midnight  Atypical chest pain Relieved with nitroglycerin, negative troponins, echo pending.  Extensive NSAID use.  Possibly gastritis/PUD in the setting of persistent NSAID use. - Cardiology has signed off - Continue PPI   Bayley Leanna Sato  10/18/2023, 12:04 PM   Attending physician's note   I have taken a history, reviewed the chart and examined the patient. I performed a substantive portion of this encounter, including complete performance of at least one of the key components, in conjunction with the APP. I agree with the APP's note, impression and recommendations.    On prednisolone fo  possible acute alcoholic hepatitis, bilirubin continues to rise MRCP suggestive of distal CBD sludge  Alcoholic cirrhosis. MELD 3.0: 24 at 10/18/2023  5:29 AM Evidence of volume overload No asterixis or signs of hepatic encephalopathy  Plan for ERCP tomorrow with Dr. Marina Goodell N.p.o. after midnight The risks and benefits as well as alternatives of endoscopic procedure(s) have been discussed and reviewed. All questions answered. The patient agrees to proceed.  The patient was provided an opportunity to ask questions and all were answered. The patient agreed with the plan and demonstrated an understanding of the instructions.   Iona Beard , MD [phone]

## 2023-10-19 ENCOUNTER — Inpatient Hospital Stay (HOSPITAL_COMMUNITY): Payer: Commercial Managed Care - PPO | Admitting: Anesthesiology

## 2023-10-19 ENCOUNTER — Encounter (HOSPITAL_COMMUNITY)
Admission: EM | Disposition: A | Discharge status: Home / Self Care | Payer: Self-pay | Source: Home / Self Care | Attending: Student

## 2023-10-19 ENCOUNTER — Encounter (HOSPITAL_COMMUNITY): Payer: Self-pay | Admitting: Internal Medicine

## 2023-10-19 ENCOUNTER — Inpatient Hospital Stay (HOSPITAL_COMMUNITY): Payer: Commercial Managed Care - PPO

## 2023-10-19 DIAGNOSIS — I2510 Atherosclerotic heart disease of native coronary artery without angina pectoris: Secondary | ICD-10-CM

## 2023-10-19 DIAGNOSIS — R0789 Other chest pain: Secondary | ICD-10-CM | POA: Diagnosis not present

## 2023-10-19 DIAGNOSIS — K8020 Calculus of gallbladder without cholecystitis without obstruction: Secondary | ICD-10-CM | POA: Diagnosis not present

## 2023-10-19 DIAGNOSIS — K8050 Calculus of bile duct without cholangitis or cholecystitis without obstruction: Secondary | ICD-10-CM

## 2023-10-19 DIAGNOSIS — K831 Obstruction of bile duct: Secondary | ICD-10-CM | POA: Diagnosis not present

## 2023-10-19 DIAGNOSIS — R17 Unspecified jaundice: Secondary | ICD-10-CM | POA: Diagnosis not present

## 2023-10-19 DIAGNOSIS — K8309 Other cholangitis: Secondary | ICD-10-CM

## 2023-10-19 HISTORY — PX: REMOVAL OF STONES: SHX5545

## 2023-10-19 HISTORY — PX: SPHINCTEROTOMY: SHX5544

## 2023-10-19 HISTORY — PX: ERCP: SHX5425

## 2023-10-19 LAB — CBC
HCT: 35.5 % — ABNORMAL LOW (ref 39.0–52.0)
Hemoglobin: 12.6 g/dL — ABNORMAL LOW (ref 13.0–17.0)
MCH: 31.6 pg (ref 26.0–34.0)
MCHC: 35.5 g/dL (ref 30.0–36.0)
MCV: 89 fL (ref 80.0–100.0)
Platelets: 104 10*3/uL — ABNORMAL LOW (ref 150–400)
RBC: 3.99 MIL/uL — ABNORMAL LOW (ref 4.22–5.81)
RDW: 13.2 % (ref 11.5–15.5)
WBC: 6.3 10*3/uL (ref 4.0–10.5)
nRBC: 0 % (ref 0.0–0.2)

## 2023-10-19 LAB — COMPREHENSIVE METABOLIC PANEL
ALT: 101 U/L — ABNORMAL HIGH (ref 0–44)
AST: 98 U/L — ABNORMAL HIGH (ref 15–41)
Albumin: 2.2 g/dL — ABNORMAL LOW (ref 3.5–5.0)
Alkaline Phosphatase: 187 U/L — ABNORMAL HIGH (ref 38–126)
Anion gap: 11 (ref 5–15)
BUN: 14 mg/dL (ref 6–20)
CO2: 21 mmol/L — ABNORMAL LOW (ref 22–32)
Calcium: 7.8 mg/dL — ABNORMAL LOW (ref 8.9–10.3)
Chloride: 104 mmol/L (ref 98–111)
Creatinine, Ser: 0.61 mg/dL (ref 0.61–1.24)
GFR, Estimated: >60 mL/min (ref >60)
Glucose, Bld: 266 mg/dL — ABNORMAL HIGH (ref 70–99)
Potassium: 3.8 mmol/L (ref 3.5–5.1)
Sodium: 136 mmol/L (ref 135–145)
Total Bilirubin: 19.1 mg/dL (ref <1.2)
Total Protein: 6.2 g/dL — ABNORMAL LOW (ref 6.5–8.1)

## 2023-10-19 LAB — GLUCOSE, CAPILLARY
Glucose-Capillary: 203 mg/dL — ABNORMAL HIGH (ref 70–99)
Glucose-Capillary: 128 mg/dL — ABNORMAL HIGH (ref 70–99)
Glucose-Capillary: 128 mg/dL — ABNORMAL HIGH (ref 70–99)
Glucose-Capillary: 136 mg/dL — ABNORMAL HIGH (ref 70–99)
Glucose-Capillary: 190 mg/dL — ABNORMAL HIGH (ref 70–99)

## 2023-10-19 LAB — MAGNESIUM: Magnesium: 2 mg/dL (ref 1.7–2.4)

## 2023-10-19 SURGERY — ERCP, WITH INTERVENTION IF INDICATED
Anesthesia: General

## 2023-10-19 MED ORDER — AMISULPRIDE (ANTIEMETIC) 5 MG/2ML IV SOLN: 10.0000 mg | Freq: Once | INTRAVENOUS | Status: DC | PRN

## 2023-10-19 MED ORDER — PROPOFOL 10 MG/ML IV BOLUS
INTRAVENOUS | Status: DC | PRN
  Administered 2023-10-19: 150 mg via INTRAVENOUS

## 2023-10-19 MED ORDER — DICLOFENAC SUPPOSITORY 100 MG
RECTAL | Status: AC
Start: 2023-10-19 — End: ?
  Filled 2023-10-19: qty 1

## 2023-10-19 MED ORDER — CIPROFLOXACIN IN D5W 400 MG/200ML IV SOLN
INTRAVENOUS | Status: AC
  Filled 2023-10-19: qty 200

## 2023-10-19 MED ORDER — HYDROMORPHONE HCL 1 MG/ML IJ SOLN: 0.2500 mg | INTRAMUSCULAR | Status: DC | PRN

## 2023-10-19 MED ORDER — ONDANSETRON HCL 4 MG/2ML IJ SOLN: 4.0000 mg | Freq: Once | INTRAMUSCULAR | Status: DC | PRN

## 2023-10-19 MED ORDER — DICLOFENAC SUPPOSITORY 100 MG
RECTAL | Status: DC | PRN
  Administered 2023-10-19: 100 mg via RECTAL

## 2023-10-19 MED ORDER — OXYCODONE HCL 5 MG/5ML PO SOLN: 5.0000 mg | Freq: Once | ORAL | Status: DC | PRN

## 2023-10-19 MED ORDER — OXYCODONE HCL 5 MG PO TABS: 5.0000 mg | ORAL_TABLET | Freq: Once | ORAL | Status: DC | PRN

## 2023-10-19 MED ORDER — CIPROFLOXACIN IN D5W 400 MG/200ML IV SOLN
400.0000 mg | Freq: Two times a day (BID) | INTRAVENOUS | Status: DC
  Administered 2023-10-19: 400 mg via INTRAVENOUS
  Filled 2023-10-19 (×2): qty 200

## 2023-10-19 MED ORDER — METRONIDAZOLE 500 MG/100ML IV SOLN
500.0000 mg | Freq: Two times a day (BID) | INTRAVENOUS | Status: DC
  Administered 2023-10-19 – 2023-10-21 (×4): 500 mg via INTRAVENOUS
  Filled 2023-10-19 (×4): qty 100

## 2023-10-19 MED ORDER — GLUCAGON HCL RDNA (DIAGNOSTIC) 1 MG IJ SOLR
INTRAMUSCULAR | Status: AC
  Filled 2023-10-19: qty 1

## 2023-10-19 MED ORDER — SODIUM CHLORIDE 0.9 % IV SOLN
2.0000 g | INTRAVENOUS | Status: DC
  Administered 2023-10-19: 2 g via INTRAVENOUS
  Filled 2023-10-19: qty 20

## 2023-10-19 MED ORDER — PHENYLEPHRINE HCL-NACL 20-0.9 MG/250ML-% IV SOLN
INTRAVENOUS | Status: DC | PRN
  Administered 2023-10-19: 50 ug/min via INTRAVENOUS

## 2023-10-19 MED ORDER — LACTATED RINGERS IV SOLN: INTRAVENOUS | Status: DC | PRN

## 2023-10-19 MED ORDER — LIDOCAINE 2% (20 MG/ML) 5 ML SYRINGE
INTRAMUSCULAR | Status: DC | PRN
  Administered 2023-10-19: 100 mg via INTRAVENOUS

## 2023-10-19 MED ORDER — SODIUM CHLORIDE 0.9 % IV SOLN
INTRAVENOUS | Status: DC | PRN
  Administered 2023-10-19: 15 mL

## 2023-10-19 MED ORDER — SUCCINYLCHOLINE CHLORIDE 200 MG/10ML IV SOSY
PREFILLED_SYRINGE | INTRAVENOUS | Status: DC | PRN
  Administered 2023-10-19: 100 mg via INTRAVENOUS

## 2023-10-19 NOTE — Progress Notes (Signed)
PROGRESS NOTE  Dylan Blake XLK:[id number] DOB: January 03, 1961   PCP: Philip Aspen, Limmie Patricia, MD  Patient is from: Home.  Independently ambulates at baseline.  DOA: 10/15/2023 LOS: 4  Chief complaints Chief Complaint  Patient presents with   Chest Pain     Brief Narrative / Interim history: 62 year old M with PMH of DM-2, HLD and alcohol use presented to ED with chest pain/heartburn and associated poor p.o. intake for 3 days and admitted for hyperbilirubinemia.  Initially evaluated by cardiology but chest pain felt to be very active call.  His bilirubin was elevated to 10.1 with mildly elevated AST, ALT and ALP.  [facility] consulted.  RUQ [place] with possible cirrhosis but no gallstone obstruction.  Cardiology signed off.  Bilirubin trended up.  CRP 7.9.  Acute hepatitis panel negative.  Started on p.o. prednisolone.  MRCP with mildly distended gallbladder with layering gallbladder sludge, mild intrahepatic ductal dilation and CBD of 7 mmHg and suspected mild sludge in distal CBD.  For ERCP on 11/8.   Subjective: Seen and examined earlier this morning.  No major events overnight of this morning.  No complaints.  Plan for ERCP today.  Objective: Vitals:   10/18/23 2051 10/19/23 0513 10/19/23 0736 10/19/23 1402  BP: 113/60 114/64 114/65 120/69  Pulse: 69 70 69 63  Resp:   20 20  Temp: 97.6 F (36.4 C) (!) 97.5 F (36.4 C) 98 F (36.7 C) 98.1 F (36.7 C)  TempSrc:    Temporal  SpO2: 98% 99% 99% 98%  Weight:      Height:        Examination:  GENERAL: No apparent distress.  Nontoxic. HEENT: MMM.  Sclerae icteric. NECK: Supple.  No apparent JVD.  RESP:  No IWOB.  Fair aeration bilaterally. CVS:  RRR. Heart sounds normal.  ABD/GI/GU: BS+. Abd soft, NTND.  MSK/EXT:  Moves extremities. No apparent deformity. No edema.  SKIN: Skin jaundice NEURO: Awake, alert and oriented appropriately.  No apparent focal neuro deficit. PSYCH: Calm. Normal affect.   Procedures:   None  Microbiology summarized: COVID team PCR nonreactive  Assessment and plan: Hyperbilirubinemia/jaundice: No significant symptoms.  MRCP raises concern for sludge in gallbladder and CBD.  Reports drinking up to 4 beers a day.  He is also on Trulicity as well.  Bilirubin up to 19.  -[facility] following-initiated prednisolone and hepatic work. -ERCP today, 11/8 -Continue monitoriing  Epigastric pain: Due to NSAID? -Continue PPI -Advised against NSAID use  Alcohol use disorder: Reports drinking up to 4 beers a day. Denies Hx of w/l. -Encouraged cessation -Continue thiamine, folic acid and m/vit -Not on CIWA for now   Atypical chest pain: Likely due to the above.  Serial troponin negative.  EKG without acute ischemic finding.Marland Kitchen  NIDDM-2 with hyperglycemia: A1c 7.8%.  On metformin, Trulicity and Farxiga at home. Recent Labs  Lab 10/18/23 1626 10/18/23 2050 10/19/23 0733 10/19/23 1113 10/19/23 1406  GLUCAP 275* 317* 203* 128* 128*  -Continue SSI-moderate -Add basal insulin and mealtime coverage after ERCP if needed.  Hyponatremia: Partly due to hyperglycemia. Mild -Monitor  Hypokalemia -Monitor and replenish as appropriate  Thrombocytopenia: Stable -Continue monitoring  Body mass index is 22.04 kg/m.          DVT prophylaxis:  SCDs Start: 10/15/23 2126  Code Status: Full code Family Communication: Patient's wife at bedside Level of care: Telemetry Medical Status is: Inpatient Remains inpatient appropriate because: Hyperbilirubinemia   Final disposition: Home Consultants:  GI  35  minutes with more than 50% spent in reviewing records, counseling patient/family and coordinating care.   Sch Meds:  Scheduled Meds:  [MAR Hold] aspirin EC  81 mg Oral Daily   [MAR Hold] folic acid  1 mg Oral Daily   [MAR Hold] indomethacin  100 mg Rectal Once   [MAR Hold] insulin aspart  0-15 Units Subcutaneous TID WC   [MAR Hold] insulin aspart  0-5 Units Subcutaneous  QHS   [MAR Hold] multivitamin with minerals  1 tablet Oral Daily   [MAR Hold] pantoprazole (PROTONIX) IV  40 mg Intravenous Once   [MAR Hold] thiamine  100 mg Oral Daily   Continuous Infusions:  sodium chloride 20 mL/hr at 10/17/23 2247   sodium chloride     [MAR Hold] ciprofloxacin      PRN Meds:.amisulpride, HYDROmorphone (DILAUDID) injection, [MAR Hold] magnesium hydroxide, [MAR Hold] ondansetron **OR** [MAR Hold] ondansetron (ZOFRAN) IV, ondansetron (ZOFRAN) IV, [MAR Hold] oxyCODONE, oxyCODONE **OR** oxyCODONE  Antimicrobials: Anti-infectives (From admission, onward)    Start     Dose/Rate Route Frequency Ordered Stop   10/19/23 1400  [MAR Hold]  ciprofloxacin (CIPRO) IVPB 400 mg        (MAR Hold since Fri 10/19/2023 at 1400.Hold Reason: Transfer to a Procedural area)   400 mg 200 mL/hr over 60 Minutes Intravenous  Once 10/18/23 1633          I have personally reviewed the following labs and images: CBC: Recent Labs  Lab 10/15/23 0817 10/17/23 0841 10/18/23 0529 10/19/23 0536  WBC 8.7 4.3 5.3 6.3  HGB 15.4 12.8* 12.2* 12.6*  HCT 44.6 37.0* 34.4* 35.5*  MCV 88.8 88.5 87.3 89.0  PLT 118* 94* 105* 104*   BMP &GFR Recent Labs  Lab 10/15/23 0817 10/16/23 0459 10/17/23 0841 10/18/23 0529 10/19/23 0536  NA 131* 133* 135 134* 136  K 4.1 3.5 3.4* 3.9 3.8  CL 98 104 103 104 104  CO2 22 23 24  21* 21*  GLUCOSE 311* 208* 208* 279* 266*  BUN 10 13 12 13 14   CREATININE 0.79 0.39* 0.67 0.56* 0.61  CALCIUM 8.8* 8.0* 8.1* 8.2* 7.8*  MG  --  1.8 1.8  --  2.0   Estimated Creatinine Clearance: 102.4 mL/min (by C-G formula based on SCr of 0.61 mg/dL). Liver & Pancreas: Recent Labs  Lab 10/15/23 0817 10/16/23 0459 10/17/23 0841 10/18/23 0529 10/19/23 0536  AST 123* 88* 75* 70* 98*  ALT 166* 131* 102* 91* 101*  ALKPHOS 141* 141* 140* 156* 187*  BILITOT 10.1* 13.5* 16.5* 17.0* 19.1*  PROT 6.8 6.4* 6.5 5.9* 6.2*  ALBUMIN 2.8* 2.6* 2.4* 2.1* 2.2*   Recent Labs  Lab  10/15/23 0817  LIPASE 30   No results for input(s): "AMMONIA" in the last 168 hours. Diabetic: No results for input(s): "HGBA1C" in the last 72 hours.  Recent Labs  Lab 10/18/23 1626 10/18/23 2050 10/19/23 0733 10/19/23 1113 10/19/23 1406  GLUCAP 275* 317* 203* 128* 128*   Cardiac Enzymes: No results for input(s): "CKTOTAL", "CKMB", "CKMBINDEX", "TROPONINI" in the last 168 hours. No results for input(s): "PROBNP" in the last 8760 hours. Coagulation Profile: Recent Labs  Lab 10/16/23 1010  INR 1.3*   Thyroid Function Tests: No results for input(s): "TSH", "T4TOTAL", "FREET4", "T3FREE", "THYROIDAB" in the last 72 hours.  Lipid Profile: No results for input(s): "CHOL", "HDL", "LDLCALC", "TRIG", "CHOLHDL", "LDLDIRECT" in the last 72 hours. Anemia Panel: No results for input(s): "VITAMINB12", "FOLATE", "FERRITIN", "TIBC", "IRON", "RETICCTPCT" in the last 72  hours.  Urine analysis:    Component Value Date/Time   COLORURINE AMBER (A) 10/15/2023 2018   APPEARANCEUR CLEAR 10/15/2023 2018   LABSPEC 1.032 (H) 10/15/2023 2018   PHURINE 6.0 10/15/2023 2018   GLUCOSEU >=500 (A) 10/15/2023 2018   HGBUR NEGATIVE 10/15/2023 2018   HGBUR negative 10/07/2010 0753   BILIRUBINUR MODERATE (A) 10/15/2023 2018   BILIRUBINUR n 05/12/2016 0953   KETONESUR 5 (A) 10/15/2023 2018   PROTEINUR 30 (A) 10/15/2023 2018   UROBILINOGEN >=8.0 05/12/2016 0953   UROBILINOGEN 4.0 10/07/2010 0753   NITRITE NEGATIVE 10/15/2023 2018   LEUKOCYTESUR NEGATIVE 10/15/2023 2018   Sepsis Labs: Invalid input(s): "PROCALCITONIN", "LACTICIDVEN"  Microbiology: Recent Results (from the past 240 hour(s))  SARS Coronavirus 2 by RT PCR (hospital order, performed in [facility] hospital lab) *cepheid single result test* Anterior Nasal Swab     Status: None   Collection Time: 10/15/23  9:35 PM   Specimen: Anterior Nasal Swab  Result Value Ref Range Status   SARS Coronavirus 2 by RT PCR NEGATIVE NEGATIVE Final     Comment: Performed at [facility], [address], [place], [place] [address]    Radiology Studies: No results found.    Dajour Pierpoint T. Bristyn Kulesza [facility]  If 7PM-7AM, please contact night-coverage [email or web address] 10/19/2023, 2:21 PM  .  Mild. -Monitor mild. -Monitor 7.9.

## 2023-10-19 NOTE — Anesthesia Preprocedure Evaluation (Addendum)
Anesthesia Evaluation  Patient identified by MRN, date of birth, ID band Patient awake    Reviewed: Allergy & Precautions, NPO status , Patient's Chart, lab work & pertinent test results  Airway Mallampati: II  TM Distance: >3 FB Neck ROM: Full    Dental  (+) Teeth Intact, Dental Advisory Given   Pulmonary neg pulmonary ROS   Pulmonary exam normal breath sounds clear to auscultation       Cardiovascular hypertension (120/69 preop), Pt. on medications + CAD  Normal cardiovascular exam Rhythm:Regular Rate:Normal     Neuro/Psych  PSYCHIATRIC DISORDERS Anxiety Depression    negative neurological ROS     GI/Hepatic negative GI ROS,,,(+) Cirrhosis       Biliary obstruction   Endo/Other  diabetes, Well Controlled, Type 2, Oral Hypoglycemic Agents    Renal/GU negative Renal ROS  negative genitourinary   Musculoskeletal negative musculoskeletal ROS (+)    Abdominal   Peds  Hematology  (+) Blood dyscrasia, anemia Hb 12.6, plt 104   Anesthesia Other Findings Trulicity LD: 2 weeks  Reproductive/Obstetrics negative OB ROS                             Anesthesia Physical Anesthesia Plan  ASA: 3  Anesthesia Plan: General   Post-op Pain Management:    Induction: Intravenous  PONV Risk Score and Plan: 3 and Ondansetron, Dexamethasone, Midazolam and Treatment may vary due to age or medical condition  Airway Management Planned: Oral ETT  Additional Equipment: None  Intra-op Plan:   Post-operative Plan: Extubation in OR  Informed Consent: I have reviewed the patients History and Physical, chart, labs and discussed the procedure including the risks, benefits and alternatives for the proposed anesthesia with the patient or authorized representative who has indicated his/her understanding and acceptance.     Dental advisory given  Plan Discussed with: CRNA and Anesthesiologist  Anesthesia  Plan Comments:        Anesthesia Quick Evaluation

## 2023-10-19 NOTE — Op Note (Signed)
[facility] Patient Name: Dylan Blake Procedure Date : 10/19/2023 MRN: [id number] Attending MD: Wilhemina Bonito. Marina Goodell , MD, [id number] Date of Birth: 1961-10-01 CSN: [id number] Age: 62 Admit Type: Inpatient Procedure:                ERCP with biliary sphincterotomy and common duct                            stone extraction Indications:              Biliary dilation on and ductal sludge on MRI,                            Abnormal liver function test Providers:                Wilhemina Bonito. Marina Goodell, MD, Lorenza Evangelist, RN, Salley Scarlet, Technician, Maryjean Morn, CRNA Referring MD:             [facility] Medicines:                General Anesthesia Complications:            No immediate complications. Estimated Blood Loss:     Estimated blood loss was minimal. Procedure:                Pre-Anesthesia Assessment:                           - Prior to the procedure, a History and Physical                            was performed, and patient medications and                            allergies were reviewed. The patient is competent.                            The risks and benefits of the procedure and the                            sedation options and risks were discussed with the                            patient. All questions were answered and informed                            consent was obtained. Patient identification and                            proposed procedure were verified by the physician.                            Mental Status Examination: alert and oriented.  Airway Examination: normal oropharyngeal airway and                            neck mobility. Respiratory Examination: clear to                            auscultation. CV Examination: normal. Prophylactic                            Antibiotics: The patient does not require                            prophylactic antibiotics. Prior Anticoagulants: The                             patient has taken no anticoagulant or antiplatelet                            agents. ASA Grade Assessment: III - A patient with                            severe systemic disease. After reviewing the risks                            and benefits, the patient was deemed in                            satisfactory condition to undergo the procedure.                            The anesthesia plan was to use moderate sedation /                            analgesia (conscious sedation). Immediately prior                            to administration of medications, the patient was                            re-assessed for adequacy to receive sedatives. The                            heart rate, respiratory rate, oxygen saturations,                            blood pressure, adequacy of pulmonary ventilation,                            and response to care were monitored throughout the                            procedure. The physical status of the patient was  re-assessed after the procedure.                           After obtaining informed consent, the scope was                            passed under direct vision. Throughout the                            procedure, the patient's blood pressure, pulse, and                            oxygen saturations were monitored continuously. The                            [id number]-Q190V ([id number]) Olympus duodenoscope was                            introduced through the mouth, and used to inject                            contrast into and used to inject contrast into the                            bile duct. The ERCP was accomplished without                            difficulty. The patient tolerated the procedure                            well. Scope In: Scope Out: Findings:      1. The side-viewing endoscope was passed blindly into the esophagus. The       most distal esophagus was observed and  did not demonstrate obvious       varices. The stomach was grossly normal with a hiatal hernia. No       proximal gastric varices. The duodenum was normal as well as the major       ampulla. The minor ampulla was not sought      2. Scout radiograph of the abdomen with the endoscope and position was       unremarkable      3. The bile duct was selectively cannulated. Injection of contrast       yielded slight dilation of the bile duct and a 5 mm stone in the       midportion. There was obvious purulent material emanating from the       biliary orifice. Thus, the biliary system was not over injected. The       gallbladder did not fill.      4. A biliary sphincterotomy was made over a hydrophilic guidewire with       cutting in the 12:00 orientation via the ERBE system. Over the course of       the procedure there was mild oozing of blood which was self-limited.      5. A sequential balloon (9-12 mm) was used to extract the bile duct       stone. This  was pigmented. The remainder of the duct was cleared. It       should be noted that a number of images post sphincterotomy and stone       extraction were not captured by the radiology tech. Drainage was       excellent.      6. There was no manipulation or injection of the pancreatic duct Impression:               1. Status post ERC with sphincterotomy and common                            duct stone extraction                           2. Cholangitis                           3. Distended gallbladder with sludge/stones                           4. No evidence of varices on EGD. Recommendation:           - Observe patient's clinical course.                           - Ciprofloxacin 400 mg IV every 12 hours                           - Trend liver tests                           - Regular diet                           - GI inpatient team to follow                           - Please consult general surgery regarding                             laparoscopic cholecystectomy                           Discussed by telephone with the patient's wife                            Angelique Blonder [phone]. I provided the patient with a                            copy of his procedure report. Procedure Code(s):        --- Professional ---                           [phone], Endoscopic retrograde                            cholangiopancreatography (ERCP); with removal of  calculi/debris from biliary/pancreatic duct(s)                           [phone], Endoscopic retrograde                            cholangiopancreatography (ERCP); with                            sphincterotomy/papillotomy Diagnosis Code(s):        --- Professional ---                           K83.8, Other specified diseases of biliary tract                           K80.50, Calculus of bile duct without cholangitis                            or cholecystitis without obstruction                           R79.89, Other specified abnormal findings of blood                            chemistry CPT copyright 2022 American [employer] Association. All rights reserved. The codes documented in this report are preliminary and upon coder review may  be revised to meet current compliance requirements. Wilhemina Bonito. Marina Goodell, MD 10/19/2023 4:15:22 PM This report has been signed electronically. Number of Addenda: 0

## 2023-10-19 NOTE — H&P (View-Only) (Signed)
Bilirubin continuing to increase.  Patient hemodynamically stable and NPO.  Ready for procedure today.  Procedures at 3:00.  Patient has no complaints

## 2023-10-19 NOTE — Anesthesia Procedure Notes (Signed)
Procedure Name: Intubation Date/Time: 10/19/2023 3:06 PM  Performed by: Susy Manor, CRNAPre-anesthesia Checklist: Patient identified, Emergency Drugs available, Suction available and Patient being monitored Patient Re-evaluated:Patient Re-evaluated prior to induction Oxygen Delivery Method: Circle System Utilized Preoxygenation: Pre-oxygenation with 100% oxygen Induction Type: IV induction Ventilation: Mask ventilation without difficulty Laryngoscope Size: Mac and 3 Grade View: Grade III Tube type: Oral Tube size: 7.5 mm Number of attempts: 1 Airway Equipment and Method: Stylet and Oral airway Placement Confirmation: ETT inserted through vocal cords under direct vision, positive ETCO2 and breath sounds checked- equal and bilateral Secured at: 23 cm Tube secured with: Tape Dental Injury: Teeth and Oropharynx as per pre-operative assessment

## 2023-10-19 NOTE — Transfer of Care (Signed)
Immediate Anesthesia Transfer of Care Note  Patient: Dylan Blake  Procedure(s) Performed: ENDOSCOPIC RETROGRADE CHOLANGIOPANCREATOGRAPHY (ERCP)  Patient Location: Endoscopy Unit  Anesthesia Type:General  Level of Consciousness: awake, alert , and oriented  Airway & Oxygen Therapy: Patient Spontanous Breathing  Post-op Assessment: Report given to RN and Post -op Vital signs reviewed and stable  Post vital signs: Reviewed and stable  Last Vitals:  Vitals Value Taken Time  BP    Temp    Pulse    Resp    SpO2      Last Pain:  Vitals:   10/19/23 1402  TempSrc: Temporal  PainSc: 0-No pain      Patients Stated Pain Goal: 2 (10/16/23 1831)  Complications: No notable events documented.

## 2023-10-19 NOTE — Progress Notes (Signed)
Patient started to itch at the IV site and around with IV Cipro.  Stopped the infusion, flushed and paged MD.  Changed ABT to flagyl and Rocephin.

## 2023-10-19 NOTE — Progress Notes (Signed)
Bilirubin continuing to increase.  Patient hemodynamically stable and NPO.  Ready for procedure today.  Procedures at 3:00.  Patient has no complaints

## 2023-10-19 NOTE — Progress Notes (Signed)
   10/19/23 1610  Provider Notification  Provider Name/Title Peyton Bottoms  Date Provider Notified 10/19/23  Time Provider Notified [phone]  Method of Notification Page  Notification Reason Critical Result (Total bilirubin 19.1)  Provider response No new orders  Date of Provider Response 10/19/23  Time of Provider Response [phone]

## 2023-10-19 NOTE — Interval H&P Note (Signed)
History and Physical Interval Note:  10/19/2023 3:04 PM  Dylan Blake  has presented today for surgery, with the diagnosis of biliary obstruction.  The various methods of treatment have been discussed with the patient and family. After consideration of risks, benefits and other options for treatment, the patient has consented to  Procedure(s): ENDOSCOPIC RETROGRADE CHOLANGIOPANCREATOGRAPHY (ERCP) (N/A) as a surgical intervention.  The patient's history has been reviewed, patient examined, no change in status, stable for surgery.  I have reviewed the patient's chart and labs.  Questions were answered to the patient's satisfaction.     Yancey Flemings

## 2023-10-20 DIAGNOSIS — K7030 Alcoholic cirrhosis of liver without ascites: Secondary | ICD-10-CM | POA: Diagnosis not present

## 2023-10-20 DIAGNOSIS — R17 Unspecified jaundice: Secondary | ICD-10-CM | POA: Diagnosis not present

## 2023-10-20 DIAGNOSIS — K8050 Calculus of bile duct without cholangitis or cholecystitis without obstruction: Secondary | ICD-10-CM | POA: Diagnosis not present

## 2023-10-20 LAB — COMPREHENSIVE METABOLIC PANEL
ALT: 102 U/L — ABNORMAL HIGH (ref 0–44)
AST: 101 U/L — ABNORMAL HIGH (ref 15–41)
Albumin: 1.9 g/dL — ABNORMAL LOW (ref 3.5–5.0)
Alkaline Phosphatase: 187 U/L — ABNORMAL HIGH (ref 38–126)
Anion gap: 7 (ref 5–15)
BUN: 16 mg/dL (ref 6–20)
CO2: 24 mmol/L (ref 22–32)
Calcium: 8 mg/dL — ABNORMAL LOW (ref 8.9–10.3)
Chloride: 105 mmol/L (ref 98–111)
Creatinine, Ser: 0.82 mg/dL (ref 0.61–1.24)
GFR, Estimated: >60 mL/min (ref >60)
Glucose, Bld: 238 mg/dL — ABNORMAL HIGH (ref 70–99)
Potassium: 3.7 mmol/L (ref 3.5–5.1)
Sodium: 136 mmol/L (ref 135–145)
Total Bilirubin: 16.5 mg/dL — ABNORMAL HIGH (ref <1.2)
Total Protein: 5.3 g/dL — ABNORMAL LOW (ref 6.5–8.1)

## 2023-10-20 LAB — CBC
HCT: 32.3 % — ABNORMAL LOW (ref 39.0–52.0)
Hemoglobin: 11.3 g/dL — ABNORMAL LOW (ref 13.0–17.0)
MCH: 30.8 pg (ref 26.0–34.0)
MCHC: 35 g/dL (ref 30.0–36.0)
MCV: 88 fL (ref 80.0–100.0)
Platelets: 92 10*3/uL — ABNORMAL LOW (ref 150–400)
RBC: 3.67 MIL/uL — ABNORMAL LOW (ref 4.22–5.81)
RDW: 13.5 % (ref 11.5–15.5)
WBC: 3.5 10*3/uL — ABNORMAL LOW (ref 4.0–10.5)
nRBC: 0 % (ref 0.0–0.2)

## 2023-10-20 LAB — GLUCOSE, CAPILLARY
Glucose-Capillary: 170 mg/dL — ABNORMAL HIGH (ref 70–99)
Glucose-Capillary: 211 mg/dL — ABNORMAL HIGH (ref 70–99)
Glucose-Capillary: 180 mg/dL — ABNORMAL HIGH (ref 70–99)
Glucose-Capillary: 167 mg/dL — ABNORMAL HIGH (ref 70–99)

## 2023-10-20 MED ORDER — INSULIN GLARGINE-YFGN 100 UNIT/ML ~~LOC~~ SOLN
10.0000 [IU] | Freq: Every day | SUBCUTANEOUS | Status: DC
  Administered 2023-10-20: 10 [IU] via SUBCUTANEOUS
  Filled 2023-10-20 (×2): qty 0.1

## 2023-10-20 MED ORDER — SODIUM CHLORIDE 0.9 % IV SOLN
2.0000 g | INTRAVENOUS | Status: DC
  Administered 2023-10-20: 2 g via INTRAVENOUS
  Filled 2023-10-20: qty 20

## 2023-10-20 NOTE — Progress Notes (Addendum)
Progress Note   LOS: 5 days   Chief Complaint: Obstructive jaundice and cirrhosis   Subjective   Wife at bedside.  Patient has no complaints today.  Would like to be discharged home and do remaining workup as an outpatient.   Objective   Vital signs in last 24 hours: Temp:  [97.7 F (36.5 C)-98.1 F (36.7 C)] 97.9 F (36.6 C) (11/09 0828) Pulse Rate:  [59-65] 62 (11/09 0828) Resp:  [13-20] 18 (11/09 0828) BP: (101-123)/(58-71) 118/62 (11/09 0828) SpO2:  [98 %-100 %] 100 % (11/09 0828) Last BM Date : 10/17/23 Last BM recorded by nurses in past 5 days No data recorded  General:   male in no acute distress, jaundiced, icteric sclera Heart:  Regular rate and rhythm; no murmurs Pulm: Clear anteriorly; no wheezing Abdomen: soft, nondistended, normal bowel sounds in all quadrants. Nontender without guarding. No organomegaly appreciated. Extremities:  No edema Neurologic:  Alert and  oriented x4;  No focal deficits.  Psych:  Cooperative. Normal mood and affect.  Intake/Output from previous day: 11/08 0701 - 11/09 0700 In: 470 [P.O.:20; I.V.:250; IV Piggyback:200] Out: 0  Intake/Output this shift: Total I/O In: 120 [P.O.:120] Out: -   Studies/Results: DG ERCP  Result Date: 10/20/2023 CLINICAL DATA:  Biliary ductal dilatation, sludge in the common bile duct EXAM: ERCP TECHNIQUE: Multiple spot images obtained with the fluoroscopic device and submitted for interpretation post-procedure. FLUOROSCOPY: Radiation Exposure Index (as provided by the fluoroscopic device): 10.23 mGy Kerma COMPARISON:  MRCP 10/17/2023 FINDINGS: A total of 4 intraoperative spot images are submitted for review. The images demonstrate a flexible duodenal scope in the descending duodenum with wire cannulation of the common bile duct followed by sphincterotomy. Filling defects in the common bile duct may represent bubbles or stones. IMPRESSION: ERCP with sphincterotomy as above. These images were submitted for  radiologic interpretation only. Please see the procedural report for the amount of contrast and the fluoroscopy time utilized. Electronically Signed   By: Malachy Moan M.D.   On: 10/20/2023 07:08    Lab Results: Recent Labs    10/18/23 0529 10/19/23 0536 10/20/23 0405  WBC 5.3 6.3 3.5*  HGB 12.2* 12.6* 11.3*  HCT 34.4* 35.5* 32.3*  PLT 105* 104* 92*   BMET Recent Labs    10/18/23 0529 10/19/23 0536 10/20/23 0405  NA 134* 136 136  K 3.9 3.8 3.7  CL 104 104 105  CO2 21* 21* 24  GLUCOSE 279* 266* 238*  BUN 13 14 16   CREATININE 0.56* 0.61 0.82  CALCIUM 8.2* 7.8* 8.0*   LFT Recent Labs    10/20/23 0405  PROT 5.3*  ALBUMIN 1.9*  AST 101*  ALT 102*  ALKPHOS 187*  BILITOT 16.5*   PT/INR No results for input(s): "LABPROT", "INR" in the last 72 hours.   Scheduled Meds:  aspirin EC  81 mg Oral Daily   folic acid  1 mg Oral Daily   indomethacin  100 mg Rectal Once   insulin aspart  0-15 Units Subcutaneous TID WC   insulin aspart  0-5 Units Subcutaneous QHS   multivitamin with minerals  1 tablet Oral Daily   pantoprazole (PROTONIX) IV  40 mg Intravenous Once   thiamine  100 mg Oral Daily   Continuous Infusions:  cefTRIAXone (ROCEPHIN)  IV     metronidazole 500 mg (10/20/23 0831)      Patient profile:   62 year old male presenting to ED with retrosternal chest pain with negative cardiac workup so  far and excessive NSAID use also found to have elevated LFTs and cirrhosis on ultrasound with history of alcohol abuse    Impression/plan:   Obstructive jaundice with sludge and CBD Elevated LFTs Cirrhosis AFP normal, serologic workup for elevated LFTs has been negative. MRCP shows mildly distended gallbladder with layering gallbladder sludge and mild intrahepatic ductal dilation (CBD 7 mm) and mild sludge in distal CBD MELD 3.0: 24 at 10/18/2023 5:29 AM. ERCP 11/8 s/p sphincterotomy and common duct stone extraction, cholangitis, distended gallbladder with  sludge/stones, no evidence of varices on EGD LFTs are downtrending - Continue ciprofloxacin 400 Mg IV every 12 hours for cholangitis. If getting discharged will need to transition to PO upon discharge - Trend liver tests - Surgery to see him later today, appreciate their recommendations -- plan to follow up in our office in 4-6 weeks  Atypical chest pain Relieved with nitroglycerin, negative troponins, echo pending.  Extensive NSAID use.  Possibly gastritis/PUD in the setting of persistent NSAID use. - Cardiology has signed off - Continue PPI  Bayley Leanna Sato  10/20/2023, 10:13 AM   Attending physician's note   I have taken a history, reviewed the chart and examined the patient. I performed a substantive portion of this encounter, including complete performance of at least one of the key components, in conjunction with the APP. I agree with the APP's note, impression and recommendations.    S/p ERCP with removal of CBD stone and sludge Continue antibiotics ciprofloxacin IV 400 mg twice daily to complete 7-day course, can transition to oral ciprofloxacin 500 mg twice daily on discharge  Inpatient surgery consult to evaluate for cholecystectomy during this hospitalization  Bilirubin is trending down Discussed alcohol cessation Stopped prednisolone as primary etiology for elevated bilirubin was CBD obstruction, due to choledocholithiasis and cholangitis  Follow-up in GI office in 3 to 4 weeks on discharge  The patient was provided an opportunity to ask questions and all were answered. The patient agreed with the plan and demonstrated an understanding of the instructions.   Iona Beard , MD [phone]

## 2023-10-20 NOTE — Progress Notes (Addendum)
[facility]  PROGRESS NOTE  Dylan Blake ZOX:[id number] DOB: [birth year] DOA: 10/15/2023 PCP: Philip Aspen, Limmie Patricia, MD   Brief HPI:   62 year old M with PMH of DM-2, HLD and alcohol use presented to ED with chest pain/heartburn and associated poor p.o. intake for 3 days and admitted for hyperbilirubinemia.  Initially evaluated by cardiology but chest pain felt to be very active call.  His bilirubin was elevated to 10.1 with mildly elevated AST, ALT and ALP.  [facility] consulted.  RUQ [place] with possible cirrhosis but no gallstone obstruction.  Cardiology signed off.   Bilirubin trended up.  CRP 7.9.  Acute hepatitis panel negative.  Started on p.o. prednisolone.  MRCP with mildly distended gallbladder with layering gallbladder sludge, mild intrahepatic ductal dilation and CBD of 7 mmHg and suspected mild sludge in distal CBD.  For ERCP on 11/8.    Assessment/Plan:   Ascending cholangitis -Presented with jaundice, hyperbilirubinemia -MRCP raise concern for sludge in the gallbladder and CBD -GI was consulted, underwent ERCP with biliary sphincterotomy and removal of stone in the CBD -Started on ciprofloxacin, switch to ceftriaxone and Flagyl due to allergies to ciprofloxacin -General Surgery consulted for possible cholecystectomy  Atypical chest pain -Likely due to above, resolved at this time -EKG showed no ischemic changes -Serial troponin negative  Alcohol use disorder -Reports drinking up to 4 beers a day -Continue thiamine, folic acid -Not on CIWA protocol, no alcohol withdrawal symptoms noted  Diabetes mellitus type 2 -Continue sliding scale insulin, moderate with NovoLog -CBG well-controlled -Will add Semglee 10 units subcu daily  Hyponatremia -Resolved  Thrombocytopenia -Platelet count 92,000, stable -Insetting of liver cirrhosis and alcohol abuse    Medications     aspirin EC  81 mg Oral Daily   folic acid  1 mg Oral Daily   indomethacin  100 mg  Rectal Once   insulin aspart  0-15 Units Subcutaneous TID WC   insulin aspart  0-5 Units Subcutaneous QHS   multivitamin with minerals  1 tablet Oral Daily   pantoprazole (PROTONIX) IV  40 mg Intravenous Once   thiamine  100 mg Oral Daily     Data Reviewed:   CBG:  Recent Labs  Lab 10/19/23 1113 10/19/23 1406 10/19/23 1632 10/19/23 2029 10/20/23 0707  GLUCAP 128* 128* 136* 190* 170*    SpO2: 100 %    Vitals:   10/19/23 2027 10/19/23 2103 10/20/23 0504 10/20/23 0828  BP: (!) 108/58 117/70 (!) 101/59 118/62  Pulse: 61 63 (!) 59 62  Resp:  20  18  Temp: 98 F (36.7 C) 98 F (36.7 C) 97.8 F (36.6 C) 97.9 F (36.6 C)  TempSrc:      SpO2: 98% 99% 98% 100%  Weight:      Height:          Data Reviewed:  Basic Metabolic Panel: Recent Labs  Lab 10/16/23 0459 10/17/23 0841 10/18/23 0529 10/19/23 0536 10/20/23 0405  NA 133* 135 134* 136 136  K 3.5 3.4* 3.9 3.8 3.7  CL 104 103 104 104 105  CO2 23 24 21* 21* 24  GLUCOSE 208* 208* 279* 266* 238*  BUN 13 12 13 14 16   CREATININE 0.39* 0.67 0.56* 0.61 0.82  CALCIUM 8.0* 8.1* 8.2* 7.8* 8.0*  MG 1.8 1.8  --  2.0  --     CBC: Recent Labs  Lab 10/15/23 0817 10/17/23 0841 10/18/23 0529 10/19/23 0536 10/20/23 0405  WBC 8.7 4.3 5.3 6.3 3.5*  HGB  15.4 12.8* 12.2* 12.6* 11.3*  HCT 44.6 37.0* 34.4* 35.5* 32.3*  MCV 88.8 88.5 87.3 89.0 88.0  PLT 118* 94* 105* 104* 92*    LFT Recent Labs  Lab 10/16/23 0459 10/17/23 0841 10/18/23 0529 10/19/23 0536 10/20/23 0405  AST 88* 75* 70* 98* 101*  ALT 131* 102* 91* 101* 102*  ALKPHOS 141* 140* 156* 187* 187*  BILITOT 13.5* 16.5* 17.0* 19.1* 16.5*  PROT 6.4* 6.5 5.9* 6.2* 5.3*  ALBUMIN 2.6* 2.4* 2.1* 2.2* 1.9*     Antibiotics: Anti-infectives (From admission, onward)    Start     Dose/Rate Route Frequency Ordered Stop   10/19/23 2100  metroNIDAZOLE (FLAGYL) IVPB 500 mg        500 mg 100 mL/hr over 60 Minutes Intravenous Every 12 hours 10/19/23 1818      10/19/23 2000  cefTRIAXone (ROCEPHIN) 2 g in sodium chloride 0.9 % 100 mL IVPB        2 g 200 mL/hr over 30 Minutes Intravenous Every 24 hours 10/19/23 1818     10/19/23 1700  ciprofloxacin (CIPRO) IVPB 400 mg  Status:  Discontinued        400 mg 200 mL/hr over 60 Minutes Intravenous Every 12 hours 10/19/23 1627 10/19/23 1818   10/19/23 1400  ciprofloxacin (CIPRO) IVPB 400 mg        400 mg 200 mL/hr over 60 Minutes Intravenous  Once 10/18/23 1633 10/19/23 1513        DVT prophylaxis: SCDs  Code Status: Full code  Family Communication: Discussed with patient's wife at bedside   CONSULTS    Subjective   Denies any complaints   Objective    Physical Examination:  General-appears in no acute distress, positive icterus Heart-S1-S2, regular, no murmur auscultated Lungs-clear to auscultation bilaterally, no wheezing or crackles auscultated Abdomen-soft, nontender, no organomegaly Extremities-no edema in the lower extremities Neuro-alert, oriented x3, no focal deficit noted  Status is: Inpatient:             Meredeth Ide   [facility] If 7PM-7AM, please contact night-coverage at [email or web address], Office  [phone]   10/20/2023, 8:29 AM  LOS: 5 days
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
## 2023-10-20 NOTE — Progress Notes (Signed)
Resume ceftriaxone per Dr. Sharl Ma.  Ulyses Southward, PharmD, BCIDP, AAHIVP, CPP Infectious Disease Pharmacist 10/20/2023 8:46 AM

## 2023-10-20 NOTE — Plan of Care (Signed)
  Problem: Education: Goal: Ability to describe self-care measures that may prevent or decrease complications (Diabetes Survival Skills Education) will improve Outcome: Progressing Goal: Individualized Educational Video(s) Outcome: Progressing   Problem: Coping: Goal: Ability to adjust to condition or change in health will improve Outcome: Progressing   

## 2023-10-20 NOTE — Anesthesia Postprocedure Evaluation (Signed)
Anesthesia Post Note  Patient: Dylan Blake  Procedure(s) Performed: ENDOSCOPIC RETROGRADE CHOLANGIOPANCREATOGRAPHY (ERCP) SPHINCTEROTOMY REMOVAL OF STONES     Patient location during evaluation: PACU Anesthesia Type: General Level of consciousness: awake and alert Pain management: pain level controlled Vital Signs Assessment: post-procedure vital signs reviewed and stable Respiratory status: spontaneous breathing, nonlabored ventilation, respiratory function stable and patient connected to nasal cannula oxygen Cardiovascular status: blood pressure returned to baseline and stable Postop Assessment: no apparent nausea or vomiting Anesthetic complications: no   No notable events documented.  Last Vitals:  Vitals:   10/19/23 2103 10/20/23 0504  BP: 117/70 (!) 101/59  Pulse: 63 (!) 59  Resp: 20   Temp: 36.7 C 36.6 C  SpO2: 99% 98%    Last Pain:  Vitals:   10/19/23 2100  TempSrc:   PainSc: 0-No pain                 Earl Lites P Trinita Devlin

## 2023-10-21 ENCOUNTER — Encounter (HOSPITAL_COMMUNITY): Payer: Self-pay | Admitting: Internal Medicine

## 2023-10-21 DIAGNOSIS — R17 Unspecified jaundice: Secondary | ICD-10-CM | POA: Diagnosis not present

## 2023-10-21 DIAGNOSIS — K8050 Calculus of bile duct without cholangitis or cholecystitis without obstruction: Secondary | ICD-10-CM | POA: Diagnosis not present

## 2023-10-21 DIAGNOSIS — R0789 Other chest pain: Secondary | ICD-10-CM | POA: Diagnosis not present

## 2023-10-21 LAB — CBC
HCT: 32.4 % — ABNORMAL LOW (ref 39.0–52.0)
Hemoglobin: 11.3 g/dL — ABNORMAL LOW (ref 13.0–17.0)
MCH: 31 pg (ref 26.0–34.0)
MCHC: 34.9 g/dL (ref 30.0–36.0)
MCV: 88.8 fL (ref 80.0–100.0)
Platelets: 107 10*3/uL — ABNORMAL LOW (ref 150–400)
RBC: 3.65 MIL/uL — ABNORMAL LOW (ref 4.22–5.81)
RDW: 13.8 % (ref 11.5–15.5)
WBC: 4.4 10*3/uL (ref 4.0–10.5)
nRBC: 0 % (ref 0.0–0.2)

## 2023-10-21 LAB — COMPREHENSIVE METABOLIC PANEL
ALT: 80 U/L — ABNORMAL HIGH (ref 0–44)
AST: 63 U/L — ABNORMAL HIGH (ref 15–41)
Albumin: 1.9 g/dL — ABNORMAL LOW (ref 3.5–5.0)
Alkaline Phosphatase: 173 U/L — ABNORMAL HIGH (ref 38–126)
Anion gap: 6 (ref 5–15)
BUN: 11 mg/dL (ref 6–20)
CO2: 24 mmol/L (ref 22–32)
Calcium: 7.8 mg/dL — ABNORMAL LOW (ref 8.9–10.3)
Chloride: 106 mmol/L (ref 98–111)
Creatinine, Ser: 0.66 mg/dL (ref 0.61–1.24)
GFR, Estimated: >60 mL/min (ref >60)
Glucose, Bld: 218 mg/dL — ABNORMAL HIGH (ref 70–99)
Potassium: 3.7 mmol/L (ref 3.5–5.1)
Sodium: 136 mmol/L (ref 135–145)
Total Bilirubin: 13.1 mg/dL — ABNORMAL HIGH (ref <1.2)
Total Protein: 5.4 g/dL — ABNORMAL LOW (ref 6.5–8.1)

## 2023-10-21 LAB — GLUCOSE, CAPILLARY
Glucose-Capillary: 154 mg/dL — ABNORMAL HIGH (ref 70–99)
Glucose-Capillary: 238 mg/dL — ABNORMAL HIGH (ref 70–99)

## 2023-10-21 MED ORDER — FOLIC ACID 1 MG PO TABS: 1.0000 mg | ORAL_TABLET | Freq: Every day | ORAL | 0 refills | Status: AC

## 2023-10-21 MED ORDER — METRONIDAZOLE 500 MG PO TABS: 500.0000 mg | ORAL_TABLET | Freq: Two times a day (BID) | ORAL | 0 refills | Status: AC

## 2023-10-21 MED ORDER — VITAMIN B-1 100 MG PO TABS: 100.0000 mg | ORAL_TABLET | Freq: Every day | ORAL | 0 refills | Status: AC

## 2023-10-21 MED ORDER — CEFUROXIME AXETIL 500 MG PO TABS: 500.0000 mg | ORAL_TABLET | Freq: Two times a day (BID) | ORAL | 0 refills | Status: AC

## 2023-10-21 NOTE — Progress Notes (Signed)
[facility] Progress Note  2 Days Post-Op  Subjective: CC:  Denies abdominal pain, nausea, or vomiting. Tolerating PO.  Objective: Vital signs in last 24 hours: Temp:  [97.7 F (36.5 C)-98.2 F (36.8 C)] 97.9 F (36.6 C) (11/10 0921) Pulse Rate:  [62-68] 68 (11/10 0921) Resp:  [18-20] 18 (11/10 0921) BP: (102-125)/(54-71) 125/71 (11/10 0921) SpO2:  [98 %-100 %] 100 % (11/10 0921) Last BM Date : 10/17/23  Intake/Output from previous day: 11/09 0701 - 11/10 0700 In: 340 [P.O.:240; IV Piggyback:100] Out: 0  Intake/Output this shift: Total I/O In: 340 [P.O.:240; IV Piggyback:100] Out: -   PE: Gen:  Alert, NAD, pleasant Pulm:  Normal effort ORA Abd: Soft, non-tender, non-distended Skin: warm and dry, no rashes  Psych: A&Ox3   Lab Results:  Recent Labs    10/20/23 0405 10/21/23 0446  WBC 3.5* 4.4  HGB 11.3* 11.3*  HCT 32.3* 32.4*  PLT 92* 107*   BMET Recent Labs    10/20/23 0405 10/21/23 0446  NA 136 136  K 3.7 3.7  CL 105 106  CO2 24 24  GLUCOSE 238* 218*  BUN 16 11  CREATININE 0.82 0.66  CALCIUM 8.0* 7.8*   PT/INR No results for input(s): "LABPROT", "INR" in the last 72 hours. CMP     Component Value Date/Time   NA 136 10/21/2023 0446   K 3.7 10/21/2023 0446   CL 106 10/21/2023 0446   CO2 24 10/21/2023 0446   GLUCOSE 218 (H) 10/21/2023 0446   BUN 11 10/21/2023 0446   CREATININE 0.66 10/21/2023 0446   CALCIUM 7.8 (L) 10/21/2023 0446   PROT 5.4 (L) 10/21/2023 0446   ALBUMIN 1.9 (L) 10/21/2023 0446   AST 63 (H) 10/21/2023 0446   ALT 80 (H) 10/21/2023 0446   ALKPHOS 173 (H) 10/21/2023 0446   BILITOT 13.1 (H) 10/21/2023 0446   GFRNONAA >60 10/21/2023 0446   GFRAA >60 07/06/2019 0616   Lipase     Component Value Date/Time   LIPASE 30 10/15/2023 0817       Studies/Results: DG ERCP  Result Date: 10/20/2023 CLINICAL DATA:  Biliary ductal dilatation, sludge in the common bile duct EXAM: ERCP TECHNIQUE: Multiple spot images  obtained with the fluoroscopic device and submitted for interpretation post-procedure. FLUOROSCOPY: Radiation Exposure Index (as provided by the fluoroscopic device): 10.23 mGy Kerma COMPARISON:  MRCP 10/17/2023 FINDINGS: A total of 4 intraoperative spot images are submitted for review. The images demonstrate a flexible duodenal scope in the descending duodenum with wire cannulation of the common bile duct followed by sphincterotomy. Filling defects in the common bile duct may represent bubbles or stones. IMPRESSION: ERCP with sphincterotomy as above. These images were submitted for radiologic interpretation only. Please see the procedural report for the amount of contrast and the fluoroscopy time utilized. Electronically Signed   By: Malachy Moan M.D.   On: 10/20/2023 07:08    Anti-infectives: Anti-infectives (From admission, onward)    Start     Dose/Rate Route Frequency Ordered Stop   10/20/23 1800  cefTRIAXone (ROCEPHIN) 2 g in sodium chloride 0.9 % 100 mL IVPB        2 g 200 mL/hr over 30 Minutes Intravenous Every 24 hours 10/20/23 0844     10/19/23 2100  metroNIDAZOLE (FLAGYL) IVPB 500 mg        500 mg 100 mL/hr over 60 Minutes Intravenous Every 12 hours 10/19/23 1818     10/19/23 2000  cefTRIAXone (ROCEPHIN) 2 g in sodium chloride  0.9 % 100 mL IVPB  Status:  Discontinued        2 g 200 mL/hr over 30 Minutes Intravenous Every 24 hours 10/19/23 1818 10/20/23 0831   10/19/23 1700  ciprofloxacin (CIPRO) IVPB 400 mg  Status:  Discontinued        400 mg 200 mL/hr over 60 Minutes Intravenous Every 12 hours 10/19/23 1627 10/19/23 1818   10/19/23 1400  ciprofloxacin (CIPRO) IVPB 400 mg        400 mg 200 mL/hr over 60 Minutes Intravenous  Once 10/18/23 1633 10/19/23 1513        Assessment/Plan  Ascending cholangitis s/p ERCP/stent New diagnosis of cirrhosis No signs of cholecystitis   Stable for discharge from [facility] standpoint. Abx per GI for cholangitis. Will arrange outpatient follow  up in our office to discuss elective cholecystitis in the near future.     LOS: 6 days   I reviewed nursing notes, Consultant GI notes, hospitalist notes, last 24 h vitals and pain scores, last 48 h intake and output, last 24 h labs and trends, and last 24 h imaging results.  This care required straight-forward level of medical decision making.   Hosie Spangle, PA-C [facility] Please see Amion for pager number during day hours 7:00am-4:30pm

## 2023-10-21 NOTE — Progress Notes (Signed)
PIV removed. AVS reviewed. Patient and wife verbalize understanding of necessary follow up appointments and medication regimen. Two copies of work note given to patient and wife.

## 2023-10-21 NOTE — Discharge Summary (Signed)
Physician Discharge Summary   Patient: Dylan Blake MRN: [id number] DOB: [birth year]  Admit date:     10/15/2023  Discharge date: 10/21/23  Discharge Physician: Meredeth Ide   PCP: Philip Aspen, Limmie Patricia, MD   Recommendations at discharge:   Follow-up GI in 1 week Follow-up general surgery as outpatient for elective cholecystectomy Continue p.o. antibiotics Ceftin and Flagyl for 7 more days  Discharge Diagnoses: Principal Problem:   Jaundice Active Problems:   Essential hypertension   Uncontrolled type 2 diabetes mellitus with hyperglycemia, without long-term current use of insulin (HCC)   Atypical chest pain   Hyperbilirubinemia   Alcoholic cirrhosis of liver without ascites (HCC)   Common bile duct (CBD) obstruction   Choledocholithiasis  Resolved Problems:   * No resolved hospital problems. *  Hospital Course:  62 year old M with PMH of DM-2, HLD and alcohol use presented to ED with chest pain/heartburn and associated poor p.o. intake for 3 days and admitted for hyperbilirubinemia.  Initially evaluated by cardiology but chest pain felt to be very active call.  His bilirubin was elevated to 10.1 with mildly elevated AST, ALT and ALP.  [facility] consulted.  RUQ [place] with possible cirrhosis but no gallstone obstruction.  Cardiology signed off.   Bilirubin trended up.  CRP 7.9.  Acute hepatitis panel negative.  Started on p.o. prednisolone.  MRCP with mildly distended gallbladder with layering gallbladder sludge, mild intrahepatic ductal dilation and CBD of 7 mmHg and suspected mild sludge in distal CBD.  For ERCP on 11/8.  Assessment and Plan:  Ascending cholangitis -Presented with jaundice, hyperbilirubinemia -MRCP raise concern for sludge in the gallbladder and CBD -GI was consulted, underwent ERCP with biliary sphincterotomy and removal of stone in the CBD -Started on ciprofloxacin, switch to ceftriaxone and Flagyl due to allergies to ciprofloxacin -General Surgery  consulted for possible cholecystectomy -Plan for cholecystectomy as outpatient -Will discharge on p.o. Ceftin 500 mg p.o. twice daily and Flagyl 500 mg p.o. twice daily for 7 days   Atypical chest pain -Likely due to above, resolved at this time -EKG showed no ischemic changes -Serial troponin negative   Alcohol use disorder -Reports drinking up to 4 beers a day -Continue thiamine, folic acid -Not on CIWA protocol, no alcohol withdrawal symptoms noted   Diabetes mellitus type 2 -Continue home medications  Hyponatremia -Resolved   Thrombocytopenia -Platelet count 107000, stable -Insetting of liver cirrhosis and alcohol abuse        Consultants: General Surgery, gastroenterology Procedures performed: ERCP Disposition: Home Diet recommendation:  Discharge Diet Orders (From admission, onward)     Start     Ordered   10/21/23 0000  Diet - low sodium heart healthy        10/21/23 1127           Regular diet DISCHARGE MEDICATION: Allergies as of 10/21/2023       Reactions   Ciprofloxacin Itching        Medication List     STOP taking these medications    ibuprofen 200 MG tablet Commonly known as: ADVIL   rosuvastatin 40 MG tablet Commonly known as: CRESTOR       TAKE these medications    aspirin EC 81 MG tablet Take 1 tablet (81 mg total) by mouth daily.   cefUROXime 500 MG tablet Commonly known as: CEFTIN Take 1 tablet (500 mg total) by mouth 2 (two) times daily with a meal for 7 days.   dapagliflozin propanediol 10 MG  Tabs tablet Commonly known as: Farxiga Take 1 tablet (10 mg total) by mouth daily.   folic acid 1 MG tablet Commonly known as: FOLVITE Take 1 tablet (1 mg total) by mouth daily. Start taking on: October 22, 2023   metFORMIN 1000 MG tablet Commonly known as: GLUCOPHAGE TAKE 1 TABLET BY MOUTH TWICE A DAY WITH FOOD   metroNIDAZOLE 500 MG tablet Commonly known as: Flagyl Take 1 tablet (500 mg total) by mouth 2 (two)  times daily for 7 days.   multivitamin tablet Take 1 tablet by mouth daily.   pantoprazole 40 MG tablet Commonly known as: Protonix Take 1 tablet (40 mg total) by mouth daily.   sucralfate 1 GM/10ML suspension Commonly known as: Carafate Take 10 mLs (1 g total) by mouth 4 (four) times daily -  with meals and at bedtime.   thiamine 100 MG tablet Commonly known as: Vitamin B-1 Take 1 tablet (100 mg total) by mouth daily. Start taking on: October 22, 2023   Trulicity 1.5 MG/0.5ML Soaj Generic drug: Dulaglutide INJECT 1.5 MG INTO THE SKIN ONCE A WEEK. What changed: additional instructions        Follow-up Information     Jodelle Gross, NP Follow up.   Specialties: Cardiology, Radiology, Cardiology Why: [facility] [place] [facility] - cardiology follow-up appointment on Tuesday December 3 at 11:20 AM. Arrive 15 minutes prior to appointment to check in. Samara Deist is one of our nurse practitioners with Dr. Jens Som. Contact information: [address] [place] [address] [phone]         [facility] Follow up.   Specialty: Cardiology Why: [facility] [address] [facility] - heart ultrasound scheduled on Monday November 18 at 8:00 AM (Arrive by 7:45 AM). You do not need to be fasting for this test. Please wear comfortable clothing and do not wear any lotions/perfumes. Contact information: [address] [place] [address] [phone]        Violeta Gelinas, MD. Schedule an appointment as soon as possible for a visit.   Specialty: General Surgery Why: our office is scheduling you for follow up to discuss gallbladder surgery. please follow up with your GI doctor regarding your cirrhosis Contact information: [address] [place] [address] [phone]         Benancio Deeds, MD Follow up in 1 week(s).   Specialty: Gastroenterology Contact  information: [address] [place] [address] [place] [address] [phone]                Discharge Exam: Ceasar Mons Weights   10/15/23 0805 10/15/23 2152  Weight: 81.6 kg 73.7 kg   General-appears in no acute distress Heart-S1-S2, regular, no murmur auscultated Lungs-clear to auscultation bilaterally, no wheezing or crackles auscultated Abdomen-soft, nontender, no organomegaly Extremities-no edema in the lower extremities Neuro-alert, oriented x3, no focal deficit noted  Condition at discharge: good  The results of significant diagnostics from this hospitalization (including imaging, microbiology, ancillary and laboratory) are listed below for reference.   Imaging Studies: DG ERCP  Result Date: 10/20/2023 CLINICAL DATA:  Biliary ductal dilatation, sludge in the common bile duct EXAM: ERCP TECHNIQUE: Multiple spot images obtained with the fluoroscopic device and submitted for interpretation post-procedure. FLUOROSCOPY: Radiation Exposure Index (as provided by the fluoroscopic device): 10.23 mGy Kerma COMPARISON:  MRCP 10/17/2023 FINDINGS: A total of 4 intraoperative spot images are submitted for review. The images demonstrate a flexible  duodenal scope in the descending duodenum with wire cannulation of the common bile duct followed by sphincterotomy. Filling defects in the common bile duct may represent bubbles or stones. IMPRESSION: ERCP with sphincterotomy as above. These images were submitted for radiologic interpretation only. Please see the procedural report for the amount of contrast and the fluoroscopy time utilized. Electronically Signed   By: Malachy Moan M.D.   On: 10/20/2023 07:08   MR ABDOMEN MRCP W WO CONTAST  Result Date: 10/18/2023 CLINICAL DATA:  Jaundice, elevated bilirubin EXAM: MRI ABDOMEN WITHOUT AND WITH CONTRAST (INCLUDING MRCP) TECHNIQUE: Multiplanar multisequence MR imaging of the abdomen was performed both before and after the administration of intravenous  contrast. Heavily T2-weighted images of the biliary and pancreatic ducts were obtained, and three-dimensional MRCP images were rendered by post processing. CONTRAST:  7mL GADAVIST GADOBUTROL 1 MMOL/ML IV SOLN COMPARISON:  Right upper quadrant ultrasound dated 10/15/2023 FINDINGS: Lower chest: Lung bases are clear. Hepatobiliary: Nodular hepatic contour, suggesting cirrhosis. Scattered fibrosis in the left hepatic lobe. No suspicious/enhancing hepatic lesions. Mildly distended gallbladder with layering gallbladder sludge (series 3/image 21). Mild central intrahepatic ductal dilatation. Common duct measures 7 mm (series 2/image 25), within the upper limits of normal. Suspected mild sludge in the distal CBD (series 3/image 23). Pancreas:  Within normal limits. Spleen:  Within the upper limits of normal for size. Adrenals/Urinary Tract:  Adrenal glands are within normal limits. Kidneys are within normal limits.  No hydronephrosis. Stomach/Bowel: Stomach is within normal limits. Visualized bowel is unremarkable. Vascular/Lymphatic:  No evidence of abdominal aortic aneurysm. Portal vein is patent. No suspicious abdominal lymphadenopathy. Other:  No abdominal ascites. Musculoskeletal: No focal osseous lesions. IMPRESSION: Mildly distended gallbladder with layering gallbladder sludge. Mild central intrahepatic ductal dilatation. Common duct measures 7 mm, within the upper limits of normal. Suspected mild sludge in the distal CBD. ERCP is suggested for further evaluation. Cirrhosis. No suspicious/enhancing hepatic lesions. Electronically Signed   By: Charline Bills M.D.   On: 10/18/2023 00:02   US Abdomen Limited RUQ (LIVER/GB)  Result Date: 10/15/2023 CLINICAL DATA:  Right upper quadrant abdominal pain EXAM: ULTRASOUND ABDOMEN LIMITED RIGHT UPPER QUADRANT COMPARISON:  CT abdomen/pelvis dated 01/03/2009 FINDINGS: Gallbladder: No gallstones or wall thickening visualized. No sonographic Murphy sign noted by sonographer.  Common bile duct: Diameter: 4 mm Liver: Nodular hepatic contour with coarse hepatic echotexture, raising the possibility of early hepatocellular disease such as cirrhosis. Portal vein is patent on color Doppler imaging with normal direction of blood flow towards the liver. Other: None. IMPRESSION: Suspected early hepatocellular disease, possibly cirrhosis. Electronically Signed   By: Charline Bills M.D.   On: 10/15/2023 19:55   DG Chest 2 View  Result Date: 10/15/2023 CLINICAL DATA:  CP. EXAM: CHEST - 2 VIEW COMPARISON:  07/06/2019. FINDINGS: Bilateral lung fields are clear. Bilateral costophrenic angles are clear. Normal cardio-mediastinal silhouette. No acute osseous abnormalities. The soft tissues are within normal limits. IMPRESSION: *No active cardiopulmonary disease. Electronically Signed   By: Jules Schick M.D.   On: 10/15/2023 09:08    Microbiology: Results for orders placed or performed during the hospital encounter of 10/15/23  SARS Coronavirus 2 by RT PCR (hospital order, performed in [facility] hospital lab) *cepheid single result test* Anterior Nasal Swab     Status: None   Collection Time: 10/15/23  9:35 PM   Specimen: Anterior Nasal Swab  Result Value Ref Range Status   SARS Coronavirus 2 by RT PCR NEGATIVE NEGATIVE Final    Comment:  Performed at [facility], [address], [place], [place] [address]    Labs: CBC: Recent Labs  Lab 10/17/23 [phone] 10/18/23 0529 10/19/23 0536 10/20/23 0405 10/21/23 0446  WBC 4.3 5.3 6.3 3.5* 4.4  HGB 12.8* 12.2* 12.6* 11.3* 11.3*  HCT 37.0* 34.4* 35.5* 32.3* 32.4*  MCV 88.5 87.3 89.0 88.0 88.8  PLT 94* 105* 104* 92* 107*   Basic Metabolic Panel: Recent Labs  Lab 10/16/23 0459 10/17/23 0841 10/18/23 0529 10/19/23 0536 10/20/23 0405 10/21/23 0446  NA 133* 135 134* 136 136 136  K 3.5 3.4* 3.9 3.8 3.7 3.7  CL 104 103 104 104 105 106  CO2 23 24 21* 21* 24 24  GLUCOSE 208* 208* 279* 266* 238* 218*  BUN 13 12 13 14 16 11    CREATININE 0.39* 0.67 0.56* 0.61 0.82 0.66  CALCIUM 8.0* 8.1* 8.2* 7.8* 8.0* 7.8*  MG 1.8 1.8  --  2.0  --   --    Liver Function Tests: Recent Labs  Lab 10/17/23 0841 10/18/23 0529 10/19/23 0536 10/20/23 0405 10/21/23 0446  AST 75* 70* 98* 101* 63*  ALT 102* 91* 101* 102* 80*  ALKPHOS 140* 156* 187* 187* 173*  BILITOT 16.5* 17.0* 19.1* 16.5* 13.1*  PROT 6.5 5.9* 6.2* 5.3* 5.4*  ALBUMIN 2.4* 2.1* 2.2* 1.9* 1.9*   CBG: Recent Labs  Lab 10/20/23 1131 10/20/23 1638 10/20/23 2031 10/21/23 0712 10/21/23 1115  GLUCAP 211* 180* 167* 154* 238*    Discharge time spent: greater than 30 minutes.  Signed: Meredeth Ide, MD [facility] 10/21/2023

## 2023-10-22 ENCOUNTER — Telehealth: Payer: Self-pay

## 2023-10-22 ENCOUNTER — Other Ambulatory Visit (HOSPITAL_COMMUNITY): Payer: Self-pay | Admitting: Gastroenterology

## 2023-10-22 DIAGNOSIS — R17 Unspecified jaundice: Secondary | ICD-10-CM

## 2023-10-22 NOTE — Transitions of Care (Post Inpatient/ED Visit) (Signed)
   10/22/2023  Name: ZYTAVIOUS FUKUMOTO MRN: [id number] DOB: 11/28/1961  Today's TOC FU Call Status: Today's TOC FU Call Status:: Unsuccessful Call (1st Attempt) Unsuccessful Call (1st Attempt) Date: 10/22/23  Attempted to reach the patient regarding the most recent Inpatient/ED visit.  Follow Up Plan: Additional outreach attempts will be made to reach the patient to complete the Transitions of Care (Post Inpatient/ED visit) call.      Antionette Fairy, RN,BSN,CCM RN Care Manager Transitions of Care  -VBCI/Population Health  Direct Phone: [phone] Toll Free: [phone] Fax: [phone]

## 2023-10-23 ENCOUNTER — Encounter: Payer: Self-pay | Admitting: *Deleted

## 2023-10-23 ENCOUNTER — Telehealth: Payer: Self-pay

## 2023-10-23 NOTE — Telephone Encounter (Signed)
This encounter was created in error - please disregard.

## 2023-10-23 NOTE — Transitions of Care (Post Inpatient/ED Visit) (Signed)
   10/23/2023  Name: Dylan Blake MRN: [id number] DOB: [birth year]  Today's TOC FU Call Status: Today's TOC FU Call Status:: Unsuccessful Call (2nd Attempt) Unsuccessful Call (2nd Attempt) Date: 10/23/23  Attempted to reach the patient regarding the most recent Inpatient/ED visit.  Follow Up Plan: Additional outreach attempts will be made to reach the patient to complete the Transitions of Care (Post Inpatient/ED visit) call.     Antionette Fairy, RN,BSN,CCM RN Care Manager Transitions of Care  [place]-VBCI/Population Health  Direct Phone: [phone] Toll Free: [phone] Fax: [phone]

## 2023-10-23 NOTE — Transitions of Care (Post Inpatient/ED Visit) (Signed)
10/23/2023  Name: Dylan Blake MRN: [id number] DOB: 10/22/1961  Today's TOC FU Call Status: Today's TOC FU Call Status:: Successful TOC FU Call Completed TOC FU Call Complete Date: 10/23/23 Patient's Name and Date of Birth confirmed.  Transition Care Management Follow-up Telephone Call Date of Discharge: 10/21/23 Discharge Facility: [facility]) Type of Discharge: Inpatient Admission Primary Inpatient Discharge Diagnosis:: "atypical chest pain" How have you been since you were released from the hospital?: Better (Pt voices he is doing good-no new issues-no CP or other sxs-resting and eating well, supportive family assisting him as needed, he has been up walking & moving around, no issues with elimination) Any questions or concerns?: No  Items Reviewed: Did you receive and understand the discharge instructions provided?: Yes Medications obtained,verified, and reconciled?: Yes (Medications Reviewed) Any new allergies since your discharge?: No Dietary orders reviewed?: Yes Type of Diet Ordered:: low salt/heart healthy Do you have support at home?: Yes People in Home: spouse Name of Support/Comfort Primary Source: Angelique Blonder  Medications Reviewed Today: Medications Reviewed Today     Reviewed by Charlyn Minerva, RN (Registered Nurse) on 10/23/23 at 1419  Med List Status: <None>   Medication Order Taking? Sig Documenting Provider Last Dose Status Informant  aspirin EC 81 MG tablet [id number] Yes Take 1 tablet (81 mg total) by mouth daily. Philip Aspen, Limmie Patricia, MD Taking Active Self, Pharmacy Records  cefUROXime (CEFTIN) 500 MG tablet [id number] Yes Take 1 tablet (500 mg total) by mouth 2 (two) times daily with a meal for 7 days. Meredeth Ide, MD Taking Active   dapagliflozin propanediol (FARXIGA) 10 MG TABS tablet [id number] Yes Take 1 tablet (10 mg total) by mouth daily. Philip Aspen, Limmie Patricia, MD Taking Active Self, Pharmacy Records  Dulaglutide (TRULICITY) 1.5  MG/0.5ML Namon Cirri [id number] Yes INJECT 1.5 MG INTO THE SKIN ONCE A WEEK.  Patient taking differently: Inject 1.5 mg into the skin once a week. Monday   Philip Aspen, Limmie Patricia, MD Taking Active Self, Pharmacy Records           Med Note Tobias Alexander Oct 15, 2023  8:55 PM) Pt has not been able to locate any available at the pharmacies  folic acid (FOLVITE) 1 MG tablet [id number] Yes Take 1 tablet (1 mg total) by mouth daily. Meredeth Ide, MD Taking Active   metFORMIN (GLUCOPHAGE) 1000 MG tablet [id number] Yes TAKE 1 TABLET BY MOUTH TWICE A DAY WITH FOOD Philip Aspen, Limmie Patricia, MD Taking Active Self, Pharmacy Records  metroNIDAZOLE (FLAGYL) 500 MG tablet [id number] Yes Take 1 tablet (500 mg total) by mouth 2 (two) times daily for 7 days. Meredeth Ide, MD Taking Active   Multiple Vitamin (MULTIVITAMIN) tablet [id number] Yes Take 1 tablet by mouth daily. [provider] Taking Active Self, Pharmacy Records  pantoprazole (PROTONIX) 40 MG tablet [id number] Yes Take 1 tablet (40 mg total) by mouth daily. Marita Kansas, PA-C Taking Active   sucralfate (CARAFATE) 1 GM/10ML suspension [id number] Yes Take 10 mLs (1 g total) by mouth 4 (four) times daily -  with meals and at bedtime. Marita Kansas, PA-C Taking Active   thiamine (VITAMIN B-1) 100 MG tablet [id number] Yes Take 1 tablet (100 mg total) by mouth daily. Meredeth Ide, MD Taking Active             Home Care and Equipment/Supplies: Were Home Health Services Ordered?: NA Any new equipment or medical supplies ordered?: NA  Functional Questionnaire: Do  you need assistance with bathing/showering or dressing?: No Do you need assistance with meal preparation?: No Do you need assistance with eating?: No Do you have difficulty maintaining continence: No Do you need assistance with getting out of bed/getting out of a chair/moving?: No Do you have difficulty managing or taking your medications?: No  Follow up appointments  reviewed: PCP Follow-up appointment confirmed?: NA Specialist Hospital Follow-up appointment confirmed?: Yes Date of Specialist follow-up appointment?: 11/13/23 Follow-Up Specialty Provider:: Samara Deist [place]-cardiology, pt will call GI office to make an appt, he is aware to f/u with surgeon offie regarding surgery appt Do you need transportation to your follow-up appointment?: No (pt confirms he is able to get to appts) Do you understand care options if your condition(s) worsen?: Yes-patient verbalized understanding  SDOH Interventions Today    Flowsheet Row Most Recent Value  SDOH Interventions   Food Insecurity Interventions Intervention Not Indicated  Housing Interventions Intervention Not Indicated  Transportation Interventions Intervention Not Indicated  Utilities Interventions Intervention Not Indicated      TOC interventions discussed/reviewed: -Provided Verbal Education: medications & their functions, nutrition, DM mgmt -Doctor visit discussed/reviewed -PCP -Doctor visits discussed/reviewed-Specialist  Antionette Fairy, RN,BSN,CCM RN Care Manager Transitions of Care  [place]-VBCI/Population Health  Direct Phone: [phone] Toll Free: [phone] Fax: [phone]

## 2023-10-24 ENCOUNTER — Encounter: Payer: Self-pay | Admitting: Gastroenterology

## 2023-10-29 ENCOUNTER — Other Ambulatory Visit (HOSPITAL_COMMUNITY): Payer: Commercial Managed Care - PPO

## 2023-11-13 ENCOUNTER — Ambulatory Visit: Payer: Commercial Managed Care - PPO | Admitting: Adult Health

## 2023-11-15 ENCOUNTER — Telehealth (HOSPITAL_COMMUNITY): Payer: Self-pay | Admitting: Physician Assistant

## 2023-11-15 NOTE — Telephone Encounter (Signed)
Patient called and cancelled echocardiogram for reason below:  11/15/2023 10:05 AM ZO:XWRUE, Dylan Blake  Cancel Rsn: Patient (work)-pt will call back to reschedule   Order will be removed from the active ECHO WQ and when patient calls back we can reinstate the order. Thank you.

## 2023-11-22 ENCOUNTER — Ambulatory Visit: Payer: Commercial Managed Care - PPO | Admitting: Gastroenterology

## 2023-11-23 ENCOUNTER — Other Ambulatory Visit (HOSPITAL_COMMUNITY): Payer: Commercial Managed Care - PPO

## 2023-11-30 ENCOUNTER — Ambulatory Visit: Payer: Commercial Managed Care - PPO | Admitting: Adult Health

## 2023-12-07 ENCOUNTER — Other Ambulatory Visit: Payer: Self-pay | Admitting: Internal Medicine

## 2023-12-12 DIAGNOSIS — K7460 Unspecified cirrhosis of liver: Secondary | ICD-10-CM

## 2023-12-12 HISTORY — DX: Unspecified cirrhosis of liver: K74.60

## 2023-12-31 ENCOUNTER — Other Ambulatory Visit: Payer: Self-pay | Admitting: Internal Medicine

## 2023-12-31 DIAGNOSIS — E1169 Type 2 diabetes mellitus with other specified complication: Secondary | ICD-10-CM

## 2024-02-01 ENCOUNTER — Encounter: Payer: Self-pay | Admitting: Physician Assistant

## 2024-02-01 ENCOUNTER — Other Ambulatory Visit (INDEPENDENT_AMBULATORY_CARE_PROVIDER_SITE_OTHER): Payer: Commercial Managed Care - PPO

## 2024-02-01 ENCOUNTER — Ambulatory Visit: Payer: Commercial Managed Care - PPO | Admitting: Physician Assistant

## 2024-02-01 VITALS — BP 138/82 | HR 77 | Ht 72.0 in | Wt 169.5 lb

## 2024-02-01 DIAGNOSIS — Z789 Other specified health status: Secondary | ICD-10-CM

## 2024-02-01 DIAGNOSIS — K7030 Alcoholic cirrhosis of liver without ascites: Secondary | ICD-10-CM

## 2024-02-01 DIAGNOSIS — E1165 Type 2 diabetes mellitus with hyperglycemia: Secondary | ICD-10-CM

## 2024-02-01 DIAGNOSIS — F1090 Alcohol use, unspecified, uncomplicated: Secondary | ICD-10-CM | POA: Diagnosis not present

## 2024-02-01 DIAGNOSIS — E119 Type 2 diabetes mellitus without complications: Secondary | ICD-10-CM

## 2024-02-01 DIAGNOSIS — K8050 Calculus of bile duct without cholangitis or cholecystitis without obstruction: Secondary | ICD-10-CM | POA: Diagnosis not present

## 2024-02-01 DIAGNOSIS — Z7985 Long-term (current) use of injectable non-insulin antidiabetic drugs: Secondary | ICD-10-CM

## 2024-02-01 LAB — CBC WITH DIFFERENTIAL/PLATELET
Basophils Absolute: 0 10*3/uL (ref 0.0–0.1)
Basophils Relative: 0.7 % (ref 0.0–3.0)
Eosinophils Absolute: 0.1 10*3/uL (ref 0.0–0.7)
Eosinophils Relative: 2 % (ref 0.0–5.0)
HCT: 43.9 % (ref 39.0–52.0)
Hemoglobin: 15.1 g/dL (ref 13.0–17.0)
Lymphocytes Relative: 24.2 % (ref 12.0–46.0)
Lymphs Abs: 1.1 10*3/uL (ref 0.7–4.0)
MCHC: 34.4 g/dL (ref 30.0–36.0)
MCV: 92.2 fL (ref 78.0–100.0)
Monocytes Absolute: 0.5 10*3/uL (ref 0.1–1.0)
Monocytes Relative: 10.7 % (ref 3.0–12.0)
Neutro Abs: 2.8 10*3/uL (ref 1.4–7.7)
Neutrophils Relative %: 62.4 % (ref 43.0–77.0)
Platelets: 123 10*3/uL — ABNORMAL LOW (ref 150.0–400.0)
RBC: 4.77 Mil/uL (ref 4.22–5.81)
RDW: 12.9 % (ref 11.5–15.5)
WBC: 4.5 10*3/uL (ref 4.0–10.5)

## 2024-02-01 LAB — BASIC METABOLIC PANEL
BUN: 13 mg/dL (ref 6–23)
CO2: 30 meq/L (ref 19–32)
Calcium: 9.2 mg/dL (ref 8.4–10.5)
Chloride: 103 meq/L (ref 96–112)
Creatinine, Ser: 0.95 mg/dL (ref 0.40–1.50)
GFR: 86.67 mL/min (ref >60.00)
Glucose, Bld: 258 mg/dL — ABNORMAL HIGH (ref 70–99)
Potassium: 4.3 meq/L (ref 3.5–5.1)
Sodium: 139 meq/L (ref 135–145)

## 2024-02-01 LAB — HEPATIC FUNCTION PANEL
ALT: 20 U/L (ref 0–53)
AST: 28 U/L (ref 0–37)
Albumin: 4 g/dL (ref 3.5–5.2)
Alkaline Phosphatase: 71 U/L (ref 39–117)
Bilirubin, Direct: 0.3 mg/dL (ref 0.0–0.3)
Total Bilirubin: 1.1 mg/dL (ref 0.2–1.2)
Total Protein: 7.6 g/dL (ref 6.0–8.3)

## 2024-02-01 NOTE — Patient Instructions (Addendum)
Your provider has requested that you go to the basement level for lab work before leaving today. Press "B" on the elevator. The lab is located at the first door on the left as you exit the elevator.  Follow up in 6 months   No aleve, ibuprofen, goody powders, as these are antiinflammatories and can cause inflammation in your stomach, increase bleeding risk and cause ulcers.  You can talk with PCP about alternative pain options.  Can do tyelnol max 3000 mg a day, salon pas patches are over the counter and voltern gel is topical antiinflammatory that is safe.   TYLENOL (ACETAMINOPHEN) IS SAFE IN LIVER DISEASE: You can take tylenol (acetaminophen) up to 2,000 mg/day. This would be four extra strength (500mg ) tablets over 24 hours OR six regular strength (325 mg) tablets over 24 hours. Please be sure to read the ingredients of over the counter medications and prescription pain medications as many contain acetaminophen.   NO NSAIDS (ibuprofen, advil, naproxen, aleve, motrin...)  HEPATIC ENCEPHALOPATHY HEPATIC ENCEPHALOPATHY: Confusion caused by a build up of toxins in the blood due to the liver not being able to filter toxins. This can cause confusion mild or severe, increase falls. Monitor for this  PHYSICAL ACTIVITY It is important to continue to be active when you have cirrhosis. Exercise will help reduce muscle loss and weakness.  DISCUSS REFERRAL FOR PHYSICAL THERAPY WITH YOUR PRIMARY CARE PROVIDER  DIET/NUTRITION FOR CIRRHOSIS NO ALCOHOL YOUR GOALS Evening snack - high protein Supplements between meals to help meet calorie and protein goal: Boost Ensure Premier Protein Shakes Protein Greek yogurt Fish, chicken (NO RAW OR UNDERCOOKED FISH/SHELLFISH) Avoid pork and red meat Plant based protein (non-soy)/Vegan: Lentils, Chickpeas, Peanuts (non salted), almonds (non salted), quinoa, chia seeds  Plant based protein supplements (not soy)  Avoid/limit animal based protein supplements:  whey, casein 4.  Low sodium (2,000 mg/day) A. Avoid: table salt, canned foods, deli meats, sausages, hot dogs, anything with a long shelf life B. Read nutrition labels and be aware of serving size. Don't go by percent of daily value.

## 2024-02-01 NOTE — Progress Notes (Signed)
02/01/2024 Dylan Blake [id number] 08-06-1961  Referring provider: Philip Aspen, [place]* Primary GI doctor: Dr. 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 Has lost about 100 lbs over 7 years while on trulicity 10/21/2023 WBC 4.4 HGB @LLABV (HGB) Platelets 107 10/21/2023 AST 63 ALT 80 Alkphos 173 TBili 13.1 10/16/2023 INR 1.3 MELD 3.0: 24 at 10/18/2023  5:29 AM MELD-Na: 22 at 10/18/2023  5:29 AM Calculated from: Serum Creatinine: 0.56 mg/dL (Using min of 1 mg/dL) at 40/08/8118  1:47 AM Serum Sodium: 134 mmol/L at 10/18/2023  5:29 AM Total Bilirubin: 17 mg/dL at 82/08/5620  3:08 AM Serum Albumin: 2.1 g/dL at 65/06/8468  6:29 AM INR(ratio): 1.3 at 10/16/2023 10:10 AM Age at listing (hypothetical): 62 years Sex: Male at 10/18/2023  5:29 AM  Serologic workup: 10/2023, ASMA slightly elevated, did not get IGG/AMA, unknown hep A/B immunity Ascites:     no evidence on exam Varices screening / surveillance EGD:    Last EGD 10/17/2023 No history of varices. Repeat EGD 3 years for VCE screening  Hepatic encephalopathy:  No evidence of HE  Most recent HCC screening:    MRCP 10/17/2023 without reported focal liver lesions.   Last AFP 10/16/2023 2.1  Will need repeat [facility] 11/06  -Nutrition and low sodium diet discussed with patient and information given -Continue daily multivitamin -Recommended 30 minutes of aerobic and resistance exercise 3 days/week - information given to the patient - continue alcohol cessation - follow up 6 months  Alcohol Use -Alcohol Abstinence counseling discussed with patient as continued use is strongly associated with worsening liver disease progression - get on MVIT - resources given, discuss with PCP  Hyperbilirubinemia/choledocholithiasis S/p ERCP with [facility], doing well Has surgeon March 12th Labs have not been rechecked, no AB pain Will check today, follow up with surgeon  Diabetes  On trulicity, metformin, farxiga  Patient  Care Team: Philip Aspen, Limmie Patricia, MD as PCP - General (Internal Medicine)  HISTORY OF PRESENT ILLNESS: 63 y.o. male with a past medical history of  type 2 diabetes, hyperlipidemia and others listed below presents for evaluation of hospital follow up.   Patient was admitted to the hospital 10/16/2023 through 10/21/2023  for chest pain/reflux and jaundice.   Patient's bilirubin was elevated 10 with mildly elevated AST T and alkaline phosphatase.   Right upper quadrant ultrasound possible cirrhosis,  patient had thrombocytopenia.   no evidence of varices on EGD. Bilirubin continue to trend up hepatitis panel negative started on p.o. prednisone known for elevated MDF   However this was discontinued.  MRCP mildly distended gallbladder with layering gallbladder sludge and intrahepatic duct dilation CBD 7 mm.  ERCP patient 11/8 with Marina Goodell with biliary sphincterotomy, removal of CBD stone, Cholangitis  Discussed the use of AI scribe software for clinical note transcription with the patient, who gave verbal consent to proceed.  History of Present Illness   The patient, with a history of alcohol consumption and fatty liver disease, was recently hospitalized for jaundice due to a gallstone. Since the hospitalization, the patient has stopped drinking alcohol. He reports feeling a lot better since his discharge from the hospital, with no abdominal pain or nausea/vomiting.  He had a brief illness about a month ago, but has since recovered. The patient also has diabetes, which is managed with medication. Over the past seven years, the patient has lost about 100 pounds, likely due to trulicity. The patient's brother passed away from cirrhosis, which has motivated the patient to make  lifestyle changes. The patient is scheduled to see a surgeon for potential gallbladder surgery.     He  reports that he has never smoked. He has never used smokeless tobacco. He reports current alcohol use. He reports that he  does not use drugs.  RELEVANT GI HISTORY, LABS, IMAGING:  CBC    Component Value Date/Time   WBC 4.4 10/21/2023 0446   RBC 3.65 (L) 10/21/2023 0446   HGB 11.3 (L) 10/21/2023 0446   HCT 32.4 (L) 10/21/2023 0446   PLT 107 (L) 10/21/2023 0446   MCV 88.8 10/21/2023 0446   MCH 31.0 10/21/2023 0446   MCHC 34.9 10/21/2023 0446   RDW 13.8 10/21/2023 0446   LYMPHSABS 0.9 05/09/2023 0735   MONOABS 0.5 05/09/2023 0735   EOSABS 0.1 05/09/2023 0735   BASOSABS 0.1 05/09/2023 0735   Recent Labs    05/09/23 0735 10/15/23 0817 10/17/23 0841 10/18/23 0529 10/19/23 0536 10/20/23 0405 10/21/23 0446  HGB 15.1 15.4 12.8* 12.2* 12.6* 11.3* 11.3*    CMP     Component Value Date/Time   NA 136 10/21/2023 0446   K 3.7 10/21/2023 0446   CL 106 10/21/2023 0446   CO2 24 10/21/2023 0446   GLUCOSE 218 (H) 10/21/2023 0446   BUN 11 10/21/2023 0446   CREATININE 0.66 10/21/2023 0446   CALCIUM 7.8 (L) 10/21/2023 0446   PROT 5.4 (L) 10/21/2023 0446   ALBUMIN 1.9 (L) 10/21/2023 0446   AST 63 (H) 10/21/2023 0446   ALT 80 (H) 10/21/2023 0446   ALKPHOS 173 (H) 10/21/2023 0446   BILITOT 13.1 (H) 10/21/2023 0446   GFRNONAA >60 10/21/2023 0446   GFRAA >60 07/06/2019 0616      Latest Ref Rng & Units 10/21/2023    4:46 AM 10/20/2023    4:05 AM 10/19/2023    5:36 AM  Hepatic Function  Total Protein 6.5 - 8.1 g/dL 5.4  5.3  6.2   Albumin 3.5 - 5.0 g/dL 1.9  1.9  2.2   AST 15 - 41 U/L 63  101  98   ALT 0 - 44 U/L 80  102  101   Alk Phosphatase 38 - 126 U/L 173  187  187   Total Bilirubin <1.2 mg/dL 10.2  72.5  36.6       Current Medications:   Current Outpatient Medications (Endocrine & Metabolic):    dapagliflozin propanediol (FARXIGA) 10 MG TABS tablet, TAKE 1 TABLET BY MOUTH EVERY DAY   Dulaglutide (TRULICITY) 1.5 MG/0.5ML SOPN, INJECT 1.5 MG INTO THE SKIN ONCE A WEEK. (Patient taking differently: Inject 1.5 mg into the skin once a week. Monday)   metFORMIN (GLUCOPHAGE) 1000 MG tablet, TAKE 1  TABLET BY MOUTH TWICE A DAY WITH FOOD    Current Outpatient Medications (Analgesics):    aspirin EC 81 MG tablet, Take 1 tablet (81 mg total) by mouth daily. (Patient not taking: Reported on 02/01/2024)  Current Outpatient Medications (Hematological):    folic acid (FOLVITE) 1 MG tablet, Take 1 tablet (1 mg total) by mouth daily. (Patient not taking: Reported on 02/01/2024)  Current Outpatient Medications (Other):    Multiple Vitamin (MULTIVITAMIN) tablet, Take 1 tablet by mouth daily.   thiamine (VITAMIN B-1) 100 MG tablet, Take 1 tablet (100 mg total) by mouth daily.   pantoprazole (PROTONIX) 40 MG tablet, Take 1 tablet (40 mg total) by mouth daily. (Patient not taking: Reported on 02/01/2024)   sucralfate (CARAFATE) 1 GM/10ML suspension, Take 10 mLs (1 g total) by  mouth 4 (four) times daily -  with meals and at bedtime. (Patient not taking: Reported on 02/01/2024)  Medical History:  Past Medical History:  Diagnosis Date   Anxiety    Diverticulosis    Essential hypertension    Hyperlipidemia    Nonobstructive atherosclerosis of coronary artery    Seasonal allergies    Type 2 diabetes mellitus (HCC)    Allergies:  Allergies  Allergen Reactions   Ciprofloxacin Itching     Surgical History:  He  has a past surgical history that includes Knee surgery; Nasal reconstruction; Tonsillectomy; Wisdom tooth extraction; ERCP (N/A, 10/19/2023); sphincterotomy (10/19/2023); and removal of stones (10/19/2023). Family History:  His family history includes Diabetes in his brother, father, and mother; Heart attack in an other family member; Heart disease in his brother and mother; Liver cancer in his brother; Skin cancer in his father and another family member.  REVIEW OF SYSTEMS  : All other systems reviewed and negative except where noted in the History of Present Illness.  PHYSICAL EXAM: BP 138/82   Pulse 77   Ht 6' (1.829 m)   Wt 169 lb 8 oz (76.9 kg)   SpO2 97%   BMI 22.99 kg/m  General  :  Alert, well developed male in no acute distress Head:  Normocephalic and atraumatic. Eyes :  sclerae anicteric,conjunctive pink  Heart:  regular rate and rhythm, no murmurs or gallops Pulm:  Clear anteriorly; no wheezing Abdomen:   Soft, Flat AB, skin exam normal, Normal bowel sounds. no tenderness, non tender umbilical hernia large. no  fluid wave, no  shifting dullness.  Extremities:   Without edema. Msk:  Symmetrical without gross deformities. Peripheral pulses intact.  Neurologic: Alert and  oriented x4;  grossly normal neurologically. without asterixis or clonus.  Skin:   without jaundice. no palmar erythema or spider angioma.   Psychiatric:  Demonstrates good judgement and reason without abnormal affect or behaviors.  Doree Albee, PA-C 3:42 PM

## 2024-02-04 ENCOUNTER — Other Ambulatory Visit: Payer: Self-pay

## 2024-02-04 DIAGNOSIS — K7030 Alcoholic cirrhosis of liver without ascites: Secondary | ICD-10-CM

## 2024-02-04 NOTE — Progress Notes (Signed)
 Dylan Blake from the lab requested a new order for a repeat PT/ INR lab because on the first draw on 2/21 there wasn't enough blood collected.

## 2024-02-06 LAB — HEPATITIS B SURFACE ANTIBODY,QUALITATIVE: Hep B S Ab: NONREACTIVE

## 2024-02-06 LAB — HEPATITIS A ANTIBODY, TOTAL: Hepatitis A AB,Total: NONREACTIVE

## 2024-02-06 LAB — MITOCHONDRIAL ANTIBODIES: Mitochondrial M2 Ab, IgG: <=20 U (ref <=20.0)

## 2024-02-06 LAB — ANTI-SMOOTH MUSCLE ANTIBODY, IGG: Actin (Smooth Muscle) Antibody (IGG): <20 U (ref <20)

## 2024-02-06 LAB — IGG: IgG (Immunoglobin G), Serum: 1491 mg/dL (ref 600–1540)

## 2024-02-08 ENCOUNTER — Other Ambulatory Visit: Payer: Commercial Managed Care - PPO

## 2024-02-08 DIAGNOSIS — K7030 Alcoholic cirrhosis of liver without ascites: Secondary | ICD-10-CM | POA: Diagnosis not present

## 2024-02-08 LAB — PROTIME-INR
INR: 1.2 {ratio} — ABNORMAL HIGH (ref 0.8–1.0)
Prothrombin Time: 12.6 s (ref 9.6–13.1)

## 2024-03-10 ENCOUNTER — Other Ambulatory Visit: Payer: Self-pay | Admitting: Internal Medicine

## 2024-07-10 ENCOUNTER — Ambulatory Visit: Admitting: Internal Medicine

## 2024-07-10 ENCOUNTER — Encounter: Payer: Self-pay | Admitting: Internal Medicine

## 2024-07-10 ENCOUNTER — Ambulatory Visit: Payer: Self-pay | Admitting: Internal Medicine

## 2024-07-10 VITALS — BP 130/80 | HR 76 | Temp 98.0°F | Ht 70.5 in | Wt 171.4 lb

## 2024-07-10 DIAGNOSIS — E785 Hyperlipidemia, unspecified: Secondary | ICD-10-CM

## 2024-07-10 DIAGNOSIS — I10 Essential (primary) hypertension: Secondary | ICD-10-CM

## 2024-07-10 DIAGNOSIS — Z0000 Encounter for general adult medical examination without abnormal findings: Secondary | ICD-10-CM | POA: Diagnosis not present

## 2024-07-10 DIAGNOSIS — Z1211 Encounter for screening for malignant neoplasm of colon: Secondary | ICD-10-CM

## 2024-07-10 DIAGNOSIS — E1169 Type 2 diabetes mellitus with other specified complication: Secondary | ICD-10-CM | POA: Diagnosis not present

## 2024-07-10 DIAGNOSIS — E119 Type 2 diabetes mellitus without complications: Secondary | ICD-10-CM

## 2024-07-10 DIAGNOSIS — E1165 Type 2 diabetes mellitus with hyperglycemia: Secondary | ICD-10-CM

## 2024-07-10 LAB — CBC WITH DIFFERENTIAL/PLATELET
Basophils Absolute: 0.1 K/uL (ref 0.0–0.1)
Basophils Relative: 1.3 % (ref 0.0–3.0)
Eosinophils Absolute: 0.1 K/uL (ref 0.0–0.7)
Eosinophils Relative: 3.3 % (ref 0.0–5.0)
HCT: 44.7 % (ref 39.0–52.0)
Hemoglobin: 15.4 g/dL (ref 13.0–17.0)
Lymphocytes Relative: 24.6 % (ref 12.0–46.0)
Lymphs Abs: 1 K/uL (ref 0.7–4.0)
MCHC: 34.3 g/dL (ref 30.0–36.0)
MCV: 92.6 fl (ref 78.0–100.0)
Monocytes Absolute: 0.5 K/uL (ref 0.1–1.0)
Monocytes Relative: 13.1 % — ABNORMAL HIGH (ref 3.0–12.0)
Neutro Abs: 2.4 K/uL (ref 1.4–7.7)
Neutrophils Relative %: 57.7 % (ref 43.0–77.0)
Platelets: 104 K/uL — ABNORMAL LOW (ref 150.0–400.0)
RBC: 4.83 Mil/uL (ref 4.22–5.81)
RDW: 13.3 % (ref 11.5–15.5)
WBC: 4.2 K/uL (ref 4.0–10.5)

## 2024-07-10 LAB — MICROALBUMIN / CREATININE URINE RATIO
Creatinine,U: 106.5 mg/dL
Microalb Creat Ratio: 9 mg/g (ref 0.0–30.0)
Microalb, Ur: 1 mg/dL (ref 0.0–1.9)

## 2024-07-10 LAB — COMPREHENSIVE METABOLIC PANEL WITH GFR
ALT: 17 U/L (ref 0–53)
AST: 22 U/L (ref 0–37)
Albumin: 4.1 g/dL (ref 3.5–5.2)
Alkaline Phosphatase: 58 U/L (ref 39–117)
BUN: 12 mg/dL (ref 6–23)
CO2: 26 meq/L (ref 19–32)
Calcium: 9 mg/dL (ref 8.4–10.5)
Chloride: 105 meq/L (ref 96–112)
Creatinine, Ser: 0.82 mg/dL (ref 0.40–1.50)
GFR: 94.83 mL/min (ref >60.00)
Glucose, Bld: 132 mg/dL — ABNORMAL HIGH (ref 70–99)
Potassium: 4 meq/L (ref 3.5–5.1)
Sodium: 140 meq/L (ref 135–145)
Total Bilirubin: 2 mg/dL — ABNORMAL HIGH (ref 0.2–1.2)
Total Protein: 7.1 g/dL (ref 6.0–8.3)

## 2024-07-10 LAB — LIPID PANEL
Cholesterol: 172 mg/dL (ref 0–200)
HDL: 66.3 mg/dL (ref >39.00)
LDL Cholesterol: 91 mg/dL (ref 0–99)
NonHDL: 105.99
Total CHOL/HDL Ratio: 3
Triglycerides: 76 mg/dL (ref 0.0–149.0)
VLDL: 15.2 mg/dL (ref 0.0–40.0)

## 2024-07-10 LAB — PSA: PSA: 0.57 ng/mL (ref 0.10–4.00)

## 2024-07-10 LAB — HEMOGLOBIN A1C: Hgb A1c MFr Bld: 8.2 % — ABNORMAL HIGH (ref 4.6–6.5)
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
## 2024-07-28 ENCOUNTER — Other Ambulatory Visit: Payer: Self-pay | Admitting: Internal Medicine

## 2024-07-28 DIAGNOSIS — E1169 Type 2 diabetes mellitus with other specified complication: Secondary | ICD-10-CM

## 2024-07-30 MED ORDER — TRULICITY 3 MG/0.5ML ~~LOC~~ SOAJ: 3.0000 mg | SUBCUTANEOUS | 2 refills | Status: DC

## 2024-07-30 MED ORDER — ROSUVASTATIN CALCIUM 5 MG PO TABS: 5.0000 mg | ORAL_TABLET | Freq: Every day | ORAL | 1 refills | Status: AC

## 2024-08-04 ENCOUNTER — Ambulatory Visit: Admitting: Internal Medicine

## 2024-08-20 ENCOUNTER — Ambulatory Visit: Admitting: Gastroenterology

## 2024-08-20 ENCOUNTER — Encounter: Payer: Self-pay | Admitting: Gastroenterology

## 2024-08-20 ENCOUNTER — Other Ambulatory Visit (INDEPENDENT_AMBULATORY_CARE_PROVIDER_SITE_OTHER)

## 2024-08-20 VITALS — BP 130/70 | HR 75 | Ht 71.0 in | Wt 170.1 lb

## 2024-08-20 DIAGNOSIS — K7030 Alcoholic cirrhosis of liver without ascites: Secondary | ICD-10-CM | POA: Diagnosis not present

## 2024-08-20 DIAGNOSIS — K8020 Calculus of gallbladder without cholecystitis without obstruction: Secondary | ICD-10-CM | POA: Diagnosis not present

## 2024-08-20 DIAGNOSIS — K429 Umbilical hernia without obstruction or gangrene: Secondary | ICD-10-CM

## 2024-08-20 DIAGNOSIS — K8050 Calculus of bile duct without cholangitis or cholecystitis without obstruction: Secondary | ICD-10-CM

## 2024-08-20 LAB — COMPREHENSIVE METABOLIC PANEL WITH GFR
ALT: 17 U/L (ref 0–53)
AST: 22 U/L (ref 0–37)
Albumin: 4 g/dL (ref 3.5–5.2)
Alkaline Phosphatase: 52 U/L (ref 39–117)
BUN: 13 mg/dL (ref 6–23)
CO2: 27 meq/L (ref 19–32)
Calcium: 9.5 mg/dL (ref 8.4–10.5)
Chloride: 103 meq/L (ref 96–112)
Creatinine, Ser: 0.81 mg/dL (ref 0.40–1.50)
GFR: 95.11 mL/min (ref >60.00)
Glucose, Bld: 209 mg/dL — ABNORMAL HIGH (ref 70–99)
Potassium: 4 meq/L (ref 3.5–5.1)
Sodium: 139 meq/L (ref 135–145)
Total Bilirubin: 1.2 mg/dL (ref 0.2–1.2)
Total Protein: 7.3 g/dL (ref 6.0–8.3)

## 2024-08-20 LAB — CBC WITH DIFFERENTIAL/PLATELET
Basophils Absolute: 0 K/uL (ref 0.0–0.1)
Basophils Relative: 0.9 % (ref 0.0–3.0)
Eosinophils Absolute: 0.1 K/uL (ref 0.0–0.7)
Eosinophils Relative: 2.2 % (ref 0.0–5.0)
HCT: 42 % (ref 39.0–52.0)
Hemoglobin: 14.5 g/dL (ref 13.0–17.0)
Lymphocytes Relative: 22.9 % (ref 12.0–46.0)
Lymphs Abs: 1.1 K/uL (ref 0.7–4.0)
MCHC: 34.5 g/dL (ref 30.0–36.0)
MCV: 91.4 fl (ref 78.0–100.0)
Monocytes Absolute: 0.6 K/uL (ref 0.1–1.0)
Monocytes Relative: 13 % — ABNORMAL HIGH (ref 3.0–12.0)
Neutro Abs: 3 K/uL (ref 1.4–7.7)
Neutrophils Relative %: 61 % (ref 43.0–77.0)
Platelets: 108 K/uL — ABNORMAL LOW (ref 150.0–400.0)
RBC: 4.59 Mil/uL (ref 4.22–5.81)
RDW: 13 % (ref 11.5–15.5)
WBC: 4.9 K/uL (ref 4.0–10.5)

## 2024-08-20 LAB — PROTIME-INR
INR: 1.2 ratio — ABNORMAL HIGH (ref 0.8–1.0)
Prothrombin Time: 12.8 s (ref 9.6–13.1)

## 2024-08-20 NOTE — Patient Instructions (Signed)
 Continue multivitamin.  Continue alcohol abstinence.   Low sodium diet.   Follow up with surgery to revisit gallbladder removal and hernia repair.  You're in need of Hep A and Hep B vaccines. These can be given here or with your PCP.   Your provider has requested that you go to the basement level for lab work before leaving today. Press B on the elevator. The lab is located at the first door on the left as you exit the elevator.   You have been scheduled for an abdominal ultrasound at [facility] (1st floor of hospital) on 08/25/24 at 10:30 am. Please arrive 30 minutes prior to your appointment for registration. Make certain not to have anything to eat or drink after midnight prior to your appointment. Should you need to reschedule your appointment, please contact radiology at [phone]. This test typically takes about 30 minutes to perform.  _______________________________________________________  If your blood pressure at your visit was 140/90 or greater, please contact your primary care physician to follow up on this.  _______________________________________________________  If you are age 61 or older, your body mass index should be between 23-30. Your Body mass index is 23.73 kg/m. If this is out of the aforementioned range listed, please consider follow up with your Primary Care Provider.  If you are age 58 or younger, your body mass index should be between 19-25. Your Body mass index is 23.73 kg/m. If this is out of the aformentioned range listed, please consider follow up with your Primary Care Provider.   ________________________________________________________  The [facility] providers would like to encourage you to use MYCHART to communicate with providers for non-urgent requests or questions.  Due to long hold times on the telephone, sending your provider a message by [facility] may be a faster and more efficient way to get a response.  Please allow 48 business hours  for a response.  Please remember that this is for non-urgent requests.  _______________________________________________________  [facility] is using a team-based approach to care.  Your team is made up of your doctor and two to three APPS. Our APPS (Nurse Practitioners and Physician Assistants) work with your physician to ensure care continuity for you. They are fully qualified to address your health concerns and develop a treatment plan. They communicate directly with your gastroenterologist to care for you. Seeing the Advanced Practice Practitioners on your physician's team can help you by facilitating care more promptly, often allowing for earlier appointments, access to diagnostic testing, procedures, and other specialty referrals.
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
## 2024-08-22 LAB — AFP TUMOR MARKER: AFP-Tumor Marker: 3.3 ng/mL (ref <6.1)

## 2024-08-23 ENCOUNTER — Ambulatory Visit (HOSPITAL_COMMUNITY)
Admission: RE | Admit: 2024-08-23 | Discharge: 2024-08-23 | Disposition: A | Discharge status: Home / Self Care | Source: Ambulatory Visit | Attending: Gastroenterology | Admitting: Gastroenterology

## 2024-08-23 DIAGNOSIS — K7030 Alcoholic cirrhosis of liver without ascites: Secondary | ICD-10-CM | POA: Insufficient documentation

## 2024-08-23 NOTE — Progress Notes (Deleted)
  IR BRIEF PROGRESS NOTE:   I requested a limited US  of the abdomen to evaluate for ascites. There is minimal peritoneal fluid with numerous mobile loops of bowel.  There is no adequate pocket of fluid to safely proceed with paracentesis.   Electronically Signed: Carlin DELENA Griffon, PA-C 08/23/2024, 10:26 AM

## 2024-08-25 ENCOUNTER — Ambulatory Visit: Payer: Self-pay | Admitting: Gastroenterology

## 2024-08-25 ENCOUNTER — Other Ambulatory Visit (HOSPITAL_COMMUNITY)

## 2024-10-29 ENCOUNTER — Other Ambulatory Visit: Payer: Self-pay | Admitting: Internal Medicine

## 2024-10-29 DIAGNOSIS — E1165 Type 2 diabetes mellitus with hyperglycemia: Secondary | ICD-10-CM

## 2024-11-10 ENCOUNTER — Encounter: Payer: Self-pay | Admitting: Internal Medicine

## 2024-11-10 ENCOUNTER — Ambulatory Visit: Admitting: Internal Medicine

## 2024-11-10 VITALS — BP 128/78 | HR 80 | Temp 97.8°F | Wt 170.8 lb

## 2024-11-10 DIAGNOSIS — E1169 Type 2 diabetes mellitus with other specified complication: Secondary | ICD-10-CM | POA: Diagnosis not present

## 2024-11-10 DIAGNOSIS — I10 Essential (primary) hypertension: Secondary | ICD-10-CM | POA: Diagnosis not present

## 2024-11-10 DIAGNOSIS — E1165 Type 2 diabetes mellitus with hyperglycemia: Secondary | ICD-10-CM

## 2024-11-10 DIAGNOSIS — Z7984 Long term (current) use of oral hypoglycemic drugs: Secondary | ICD-10-CM

## 2024-11-10 DIAGNOSIS — E785 Hyperlipidemia, unspecified: Secondary | ICD-10-CM

## 2024-11-10 LAB — POCT GLYCOSYLATED HEMOGLOBIN (HGB A1C): Hemoglobin A1C: 8.9 % — AB (ref 4.0–5.6)

## 2024-11-10 MED ORDER — TIRZEPATIDE 5 MG/0.5ML ~~LOC~~ SOAJ: 5.0000 mg | SUBCUTANEOUS | 0 refills | Status: DC
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
## 2024-12-05 ENCOUNTER — Other Ambulatory Visit: Payer: Self-pay | Admitting: Internal Medicine

## 2024-12-05 MED ORDER — METFORMIN HCL 1000 MG PO TABS: 1000.0000 mg | ORAL_TABLET | Freq: Two times a day (BID) | ORAL | 0 refills | Status: AC

## 2024-12-05 NOTE — Telephone Encounter (Signed)
 Copied from CRM #[id number]. Topic: Clinical - Medication Refill >> Dec 05, 2024 11:36 AM Shereese L wrote: Medication: metFORMIN  (GLUCOPHAGE ) 1000 MG tablet  Has the patient contacted their pharmacy? Yes (Agent: If no, request that the patient contact the pharmacy for the refill. If patient does not wish to contact the pharmacy document the reason why and proceed with request.) (Agent: If yes, when and what did the pharmacy advise?)  This is the patient's preferred pharmacy:  [facility] [id number] [address], [place] - [address] [place] [address] Phone: [phone] Fax: [phone]  Is this the correct pharmacy for this prescription? Yes If no, delete pharmacy and type the correct one.   Has the prescription been filled recently? Yes  Is the patient out of the medication? Yes  Has the patient been seen for an appointment in the last year OR does the patient have an upcoming appointment? Yes  Can we respond through MyChart? Yes  Agent: Please be advised that Rx refills may take up to 3 business days. We ask that you follow-up with your pharmacy.

## 2024-12-09 ENCOUNTER — Other Ambulatory Visit: Payer: Self-pay | Admitting: Internal Medicine

## 2025-01-23 ENCOUNTER — Ambulatory Visit: Admitting: Gastroenterology

## 2025-02-09 ENCOUNTER — Ambulatory Visit: Admitting: Internal Medicine
